# Patient Record
Sex: Female | Born: 1935 | Race: White | Hispanic: No | Smoking: Never smoker
Health system: Southern US, Community
[De-identification: ages and names within clinical notes are randomized; demographics above are authoritative.]

## PROBLEM LIST (undated history)

## (undated) DIAGNOSIS — J40 Bronchitis, not specified as acute or chronic: Secondary | ICD-10-CM

## (undated) DIAGNOSIS — Z8719 Personal history of other diseases of the digestive system: Secondary | ICD-10-CM

## (undated) DIAGNOSIS — C801 Malignant (primary) neoplasm, unspecified: Secondary | ICD-10-CM

## (undated) DIAGNOSIS — K219 Gastro-esophageal reflux disease without esophagitis: Secondary | ICD-10-CM

## (undated) DIAGNOSIS — Z9889 Other specified postprocedural states: Secondary | ICD-10-CM

## (undated) DIAGNOSIS — T4145XA Adverse effect of unspecified anesthetic, initial encounter: Secondary | ICD-10-CM

## (undated) DIAGNOSIS — T8859XA Other complications of anesthesia, initial encounter: Secondary | ICD-10-CM

## (undated) DIAGNOSIS — IMO0001 Reserved for inherently not codable concepts without codable children: Secondary | ICD-10-CM

## (undated) DIAGNOSIS — M1990 Unspecified osteoarthritis, unspecified site: Secondary | ICD-10-CM

## (undated) DIAGNOSIS — I10 Essential (primary) hypertension: Secondary | ICD-10-CM

## (undated) DIAGNOSIS — Z87442 Personal history of urinary calculi: Secondary | ICD-10-CM

## (undated) DIAGNOSIS — R112 Nausea with vomiting, unspecified: Secondary | ICD-10-CM

## (undated) DIAGNOSIS — Z8489 Family history of other specified conditions: Secondary | ICD-10-CM

## (undated) HISTORY — PX: EYE SURGERY: SHX253

## (undated) HISTORY — PX: HEMORRHOID SURGERY: SHX153

## (undated) HISTORY — PX: NASAL SINUS SURGERY: SHX719

## (undated) HISTORY — PX: ABDOMINAL HYSTERECTOMY: SHX81

## (undated) HISTORY — PX: JOINT REPLACEMENT: SHX530

## (undated) HISTORY — PX: CHOLECYSTECTOMY: SHX55

## (undated) HISTORY — PX: SHOULDER OPEN ROTATOR CUFF REPAIR: SHX2407

---

## 2002-08-26 HISTORY — PX: CARDIAC CATHETERIZATION: SHX172

## 2007-10-14 ENCOUNTER — Inpatient Hospital Stay (HOSPITAL_COMMUNITY): Admission: RE | Admit: 2007-10-14 | Discharge: 2007-10-19 | Payer: Self-pay | Admitting: Orthopedic Surgery

## 2009-01-24 ENCOUNTER — Ambulatory Visit (HOSPITAL_COMMUNITY): Admission: RE | Admit: 2009-01-24 | Discharge: 2009-01-25 | Payer: Self-pay | Admitting: Orthopedic Surgery

## 2010-12-03 LAB — BASIC METABOLIC PANEL
BUN: 13 mg/dL (ref 6–23)
Calcium: 9.3 mg/dL (ref 8.4–10.5)
Creatinine, Ser: 0.82 mg/dL (ref 0.4–1.2)
GFR calc non Af Amer: >60 mL/min (ref >60)
Glucose, Bld: 90 mg/dL (ref 70–99)

## 2011-01-08 NOTE — H&P (Signed)
Kelly Bird, Kelly Bird               ACCOUNT NO.:  [id number]   MEDICAL RECORD NO.:  [id number]          PATIENT TYPE:  INP   LOCATION:  NA                           FACILITY:  [facility]   PHYSICIAN:  Kelly Bird, M.D.  DATE OF BIRTH:  1926-06-24   DATE OF ADMISSION:  10/14/2007  DATE OF DISCHARGE:                              HISTORY & PHYSICAL   PRESENT ILLNESS:  This 77 year old white female has been seen by [place] for  continued progressive problems concerning pain into her knees, more so  on the right than the left.  She is a very active lady, tries to me  maintain a high level of activity but, unfortunately, the osteoarthritis  that has been documented both by examination and x-ray is markedly  interfering with her day-to-day activities.  X-rays taken last December  have shown that the patient does have bilateral medial compartment  osteoarthritis but the right knee has shown a bone-on-bone deformity and  medial shift of the femur onto the tibia.  Dr. Simonne Bird discussed with  her all the options and went over the operative procedure, showing x-  rays as well as models, and after much discussion including the risks  and benefits of surgery, it was decided to go ahead with total knee  replacement arthroplasty to the right knee.   PAST MEDICAL HISTORY:  This lady has been in relatively good health  throughout her lifetime.  Her primary care physician is Dr. Zachery Bird  at [facility] in [place], [place].  His CNA Ms.  Kelly Bird.   PAST MEDICAL HISTORY:  1. Degenerative joint disease.  2. Arthritis.  3. She has had a colonoscopy in the past.  4. Hypercholesterolemia.  5. Hypertension.  6. Hyperlipidemia.  7. Osteoporosis.  8. Reflux.  9. History of a cholecystectomy.  10.Hysterectomy.  11.Nephrolithiasis.   CURRENT MEDICATIONS:  1. Toprol XL 50 mg tablet ER daily.  2. Zocor 20 mg tablet one daily.  3. Clarinex 5 mg tablet daily.  4. Xanax 0.25 mg  p.r.n.  5. Vitamin B12.  6. Glucosamine/chondroitin.  7. Vitamin E.  8. Vitamins and minerals.  9. Calcium plus D.  10.Meclizine.  11.Promethazine.  12.Xanax XR.  The last nine medications she will stop prior to      surgery.   She is allergic to ASPIRIN and SALICYLATES and NOVOCAIN.  She also  writes that she is allergic to __________ fluid pill and BENADRYL.   FAMILY HISTORY:  The __________ died at 70 of heart attacks, the mother  37 cancer.   SOCIAL HISTORY:  The patient is widowed.  Never any intake of alcohol or  tobacco products.  Has two children, lives alone in a two-level home.  She has a living will.   REVIEW OF SYSTEMS:  CNS: No seizure, stroke or paralysis, numbness,  double vision.  RESPIRATORY:  No productive cough, no hemoptysis, no  shortness of breath.  CARDIOVASCULAR:  No chest pain, no angina or  orthopnea.  GASTROINTESTINAL:  No nausea or vomiting, melena or bloody  stools.  GENITOURINARY:  No  discharge, dysuria or hematuria.  MUSCULOSKELETAL:  Primarily in present illness with her knees.   PHYSICAL EXAM:  This is an alert and cooperative, friendly, fully-  oriented 77 year old white female who is accompanied by her daughter,  Kelly Bird.  VITAL SIGNS:  Blood pressure 158/86 right arm seated, pulse 80 and  regular, respirations 12.  HEENT:  Normocephalic.  PERRLA, EOM intact.  Oropharynx is clear.  CHEST:  Clear to auscultation.  No rhonchi, no rales, no wheezes.  HEART:  Regular rate and rhythm.  No murmurs are heard.  ABDOMEN:  Obese, soft, nontender.  Liver and spleen not felt.  GENITALIA, RECTAL, PELVIC, BREASTS:  Not done, not pertinent to present  illness.  EXTREMITIES:  Right knee is seen with some crepitus on range of motion  and some popping noted as well.  No gross effusion but there is some  mild swelling, and the knee is not warm and no erythema.   ADMITTING DIAGNOSES:  1. Osteoarthritis of both knees, more so on the right than the left.  2.  Degenerative joint disease.  3. Hypertension.  4. Hyperlipidemia.  5. Osteoporosis.  6. Reflux.   PLAN:  The patient will undergo a right total knee replacement  arthroplasty.  She will probably use a spinal for this procedure as was  we discussed during the history and physical.  She chooses to have her  own physical therapist in [place], [place].  We certainly will approve  this.  There is mention of having a home CPM.  We do not usually do this  but we will certainly see how she does postoperatively.  She also  mentioned that Dr. Dorna Bird of [facility] will be covering her  Coumadin protocol postoperatively.  The literature suggests, and we  usually  patients with total joint replacements on Coumadin protocol for 4 weeks  after the date of surgery.  We endeavor to keep the INR at 2.0 during  that time.  Today all questions were encouraged and answered from Ms.  Den[place] and from her daughter.      Kelly Bird.    ______________________________  Kelly Bird, M.D.    DLU/MEDQ  D:  10/08/2007  T:  10/09/2007  Job:  [id number]   cc:   Kelly Bird  Fax: [phone]   Kelly Richmond, CNA  [facility]  FAX [phone]

## 2011-01-08 NOTE — Op Note (Signed)
Kelly Bird, Kelly Bird               ACCOUNT NO.:  [id number]   MEDICAL RECORD NO.:  [id number]          PATIENT TYPE:  OIB   LOCATION:  0098                         FACILITY[facility]  [facility]   PHYSICIAN:  [name], M.D.  DATE OF BIRTH:  [birth year]   DATE OF PROCEDURE:  01/24/2009  DATE OF DISCHARGE:                               OPERATIVE REPORT   PREOPERATIVE DIAGNOSIS:  Stiff right total knee replacement.   POSTOPERATIVE DIAGNOSIS:  Stiff right total knee replacement.   OPERATION:  Right knee arthrotomy with lysis of extensive adhesions.   SURGEON:  [name], M.D.   ASSISTANT:  Nurse.   ANESTHESIA:  General.   PATHOLOGY AND INDICATIONS FOR PROCEDURE:  Original surgery was on  October 14, 2007, and at the time of her original discharge, she had  acceptable knee motion from 0 to roughly 90 degrees or more.  Subsequently, however, she lost her knee flexion and, despite aggressive  physical therapy, has never been able to regain it to her satisfaction  and consequently she is here today for the above-mentioned surgical  procedure.   PROCEDURE:  Prophylactic antibiotics, satisfactory spinal anesthesia,  pneumatic tourniquet applied and right leg was Esmarched out non  sterilely and inflated to 300 mmHg.  Right leg was then prepped with  DuraPrep from tourniquet to ankle and draped in sterile field, I-band  employed.  Time-out performed.  I went through the old surgical  incision, leaving some untouched at the proximal and distal extremes,  and then to the patella and with median parapatellar incision opened the  joint.  There was no evidence for any infection or component breakage or  damage.  I then began freeing up the patellar mechanism.  She had  extensive suprapatellar adhesions.  I used mainly scissors for lysis of  adhesions, but also cutting cautery.  I did not use the knife to avoid  any nicking of the components.  I eventually was able to free her up so  that her  calf could touch her thigh which was equivalent about a 105-110  degrees of flexion.  This was the maximal I could achieve.  I then  irrigated the wound well and closed the deeper tissues with the knee in  this amount of flexion with multiple interrupted #1 Vicryl.  For the  subcutaneous tissue, I then brought the knee into full extension.  I did  a slight lateral parapatellar release and also placed several Titus  stitches in the capsule medially with the knee in extension.  Subcutaneous tissue was closed with 2-0 Vicryl staples in the skin.  Betadine and Adaptic dry sterile dressing were applied.  Tourniquet was  released with slightly less than an hour of tourniquet time.  She  tolerated the procedure well and was taken to the recovery room in  satisfactory condition with no known complications.           ______________________________  [name], M.D.    JA/MEDQ  D:  01/24/2009  T:  01/24/2009  Job:  [id number]

## 2011-01-08 NOTE — Op Note (Signed)
Kelly Bird, Kelly Bird               ACCOUNT NO.:  [id number]   MEDICAL RECORD NO.:  [id number]          PATIENT TYPE:  INP   LOCATION:  0004                         FACILITY:  [facility]   PHYSICIAN:  [name], M.D.  DATE OF BIRTH:  08/15/1926   DATE OF PROCEDURE:  10/14/2007  DATE OF DISCHARGE:                               OPERATIVE REPORT   PREOPERATIVE DIAGNOSIS:  Osteoarthritis, right knee.   POSTOPERATIVE DIAGNOSIS:  Osteoarthritis, right knee.   OPERATION:  Osteonix total knee replacement, right.   SURGEON:  [name], M.D.   ASSISTANTDruscilla Brownie. Underwood, P.A.-C.   ANESTHESIA:  General.   PATHOLOGY AND JUSTIFICATION FOR PROCEDURE:  Painful right knee  unresponsive to all other treatments and consequently, she is here today  for the above mentioned surgery.  She had close to but not quite bone on  bone abutment medially in her knee joint.   DESCRIPTION OF PROCEDURE:  Prophylactic antibiotics, satisfactory  general anesthesia, Foley catheter inserted, pneumatic tourniquet,  SureFoot, and lateral hip stabilizer.  The right leg was prepped with  DuraPrep from tourniquet to ankle and draped in a sterile field, Ioban  employed.  The leg was Esmarch'd out sterilely.  A time out performed.  A vertical midline incision down to the patellar mechanism with medial  parapatellar incision to open the joint.  The pes anserinus and medial  collateral ligament were freed up from the proximal medial tibia.  The  patellar mechanism was freed up.  The patella was everted and the knee  flexed.  The remnants of the ACL and PCL were resected as well as  portions of both menisci.  A 5/16 inch drill hole was placed in the  distal femur followed by the canal finder and the axis aligner set for 5  degrees of valgus cut for the right knee.  10 mm bone was resected from  the distal femur.  She had no appreciable flexion contracture.  I then  sized her femur at a #7 using the cradle  apparatus and scribe lines were  placed on the distal femur followed by a distal femoral cutting jig  where anterior and posterior cuts and anterior and posterior chamfers  were made.  I then went to the tibia where I made a leveling cut,  remnants of both menisci were excised.  I then sized the tibia at a  probable 5 and using the baseplate, the initial intramedullary drill  hole was made followed by the step drill and the canal finder.  I then  used the intramedullary rod with the jig for making the proximal tibial  cuts at 90 degrees.  I took 4 mm off recess medial tibial plateau.  We  turned to the femur.  I then used the jig for creating the patellar  groove and also the notch plasty.  After this was completed, I placed  the laminar spreader and removed remnants of bone and soft tissue from  the posterior femoral condyles.  I then went through a trial reduction  finding a 10 mm spacer seemed to be idea.  The knee went into neutral  and extension and had good stability.  I used the rod to align the  tibial tray with the rod splitting the bimalleolar interval.  Scribe  lines were placed on the tibia for use later.  I then measured the  patella, it was a size 26, and using the 10 mm recessed cutting jig, we  made our 10 mm recess cut and then used the guide placing the three peg  holes.  I then placed the trial patella and trimmed bone from around the  perimeter.  I then returned to the tibia.  I placed the baseplate  placing three holes on the tibia using the previously noted markings and  then using the tripod apparatus, we reamed for the keel up to a 5  cemented.  We then water picked the knee well while the components were  opened and methacrylate was mixed.  I then cemented the individual  components with a glue gun, starting first with the tibia, impacting it,  and removing excess methacrylate.  I then glued in the femur and  impacted and removed excess methyl methacrylate.  I held  the knee in  extension with the 10 mm spacer while we glued in the patella holding it  with a patellar holding clamp.  Bone wax placed on areas of raw bone.  Once the methacrylate had hardened, I removed the patellar holder and we  inspected the components for any small portions of methacrylate  remaining.  The 10 mm spacer trial appeared to be the correct size and  we then placed the final 10 mm posterior stabilized insert.  The knee  had excellent flexion extension and the patella was stable, no lateral  release was required.  I placed a Hemovac and we closed the wound in  layers with interrupted #1 Vicryl in two layers in the quadriceps tendon  distally and synovium and capsule with the same, the subcutaneous  tissues closed with a combination of #1 and 2-0 Vicryl, and staples for  the skin.  A dry, sterile dressing was applied.  The tourniquet was  released.  A knee immobilizer was applied.  She tolerated the procedure  well and was taken to the recovery room in satisfactory condition with  no known complications and no blood loss.           ______________________________  [name], M.D.     JA/MEDQ  D:  10/14/2007  T:  10/14/2007  Job:  [id number]

## 2011-01-11 NOTE — Discharge Summary (Signed)
Kelly Bird, Bird               ACCOUNT NO.:  [id number]   MEDICAL RECORD NO.:  [id number]          PATIENT TYPE:  INP   LOCATION:  1604                         FACILITY:  [facility]   PHYSICIAN:  [name], M.D.  DATE OF BIRTH:  July 11, 1926   DATE OF ADMISSION:  10/14/2007  DATE OF DISCHARGE:  10/19/2007                               DISCHARGE SUMMARY   ADMITTING DIAGNOSES:  1. Osteoarthritis both knees more from the right than left.  2. Degenerative joint disease.  3. Hypertension.  4. Hyperlipidemia.  5. Osteoporosis.  6. Reflux.   DISCHARGE DIAGNOSES:  1. Osteoarthritis both knees more from the left.  2. Degenerative joint disease.  3. Hypertension.  4. Hyperlipidemia.  5. Osteoporosis.  6. Reflux.  7. Mild postoperative anemia.   OPERATION:  On October 14, 2007 the patient underwent Osteonics total  knee replacement arthroplasty of the right knee.  [name]  assisted.   CONSULTATIONS:  None.   BRIEF HISTORY:  This 77 year old lady had continuing problems concerning  both of her knees.  She has had bilateral medial compartment arthritis  and developed bone-on-bone deformity on the right knee.  Also had  shifted a femur onto the tibia.  After much discussion including risks  and benefits of surgery it was decided she would benefit with surgical  intervention, and was admitted for the above procedure.   COURSE IN THE HOSPITAL:  The patient tolerated surgical procedure quite  well.  She was placed in the therapy protocol postoperatively.  She  progressed very nicely in therapy.  Wound remained clean and dry.  There  were no postoperative complications.  We understood that Dr. Dorna Leitz, her  family physician in [place], is going to control her protimes, keep the  INR around 2, and once this was done it was felt that she could be  maintained in her home environment with home health, and arrangements  were made for discharge.   She was placed on Coumadin  protocol postoperatively for the prevention  of DVT.  She has had all of her equipment at home, and asked for  [name] to help her with her home health.  Once this was arranged we  discharged her home.   LABORATORY DATA:  Laboratory values in the hospital hematologically  showed a CBC within normal limits preoperatively.  She did drop to 9.4  postoperatively, and eventually her hemoglobin came up to 10.6.  Blood  chemistries were negative other than very slightly elevated nonfasting  glucose level.  Urinalysis negative for urinary tract infection.  No  chest x-ray or electrocardiogram seen on this chart.   CONDITION ON DISCHARGE:  Improved, stable.   PLAN:  The patient discharged to her home to continue with the home  health for protocol.  She is given Vicodin for pain, Robaxin for muscle  relaxant, and Coumadin per Dr. Dorna Leitz.  Also Trinsicon one b.i.d. for  anemia.  She is to resume her home diet and medications per Dr. Dorna Leitz.   MEDICATIONS AT DISCHARGE:  1. Toprol XL 50 mg tab ER daily.  2. Zocor  20 mg tab daily.  3. Clarinex 5 mg tablet daily.  4. Xanax 0.25 mg p.r.n.  5. Vitamin B12.  6. Glucosamine chondroitin.  7. Vitamin E.  8. Vitamins and minerals.  9. Calcium Plus D.  10.Meclizine.  11.Promethazine.   She will return to see [place] two weeks after date of surgery.  Use dry  dressing on as needed basis and may weightbear as tolerated.      [name].    ______________________________  [name], M.D.    DLU/MEDQ  D:  11/04/2007  T:  11/05/2007  Job:  [id number]   cc:   Suzy Bouchard  Fax: [phone]   Carmin Richmond, CNA  [facility]  fax #[phone]

## 2011-01-11 NOTE — Discharge Summary (Signed)
NAMEMARISKA, DAFFIN               ACCOUNT NO.:  [id number]   MEDICAL RECORD NO.:  [id number]          PATIENT TYPE:  INP   LOCATION:  1604                         FACILITY:  [facility]   PHYSICIAN:  Dooley L. Underwood, P.A.DATE OF BIRTH:  February 17, 1926   DATE OF ADMISSION:  10/14/2007  DATE OF DISCHARGE:  10/19/2007                               DISCHARGE SUMMARY   ADDENDUM   This discharge summary was dictated on 11/04/2007, dictation # is [id number]      Terie Purser L. Cherlynn June.     DLU/MEDQ  D:  11/05/2007  T:  11/05/2007  Job:  [id number]

## 2011-05-17 LAB — PROTIME-INR
INR: 1.2
INR: 2.3 — ABNORMAL HIGH
INR: 2.6 — ABNORMAL HIGH
Prothrombin Time: 13.4
Prothrombin Time: 26.1 — ABNORMAL HIGH

## 2011-05-17 LAB — URINALYSIS, ROUTINE W REFLEX MICROSCOPIC
Bilirubin Urine: NEGATIVE
Glucose, UA: NEGATIVE
Ketones, ur: NEGATIVE
Leukocytes, UA: NEGATIVE
Protein, ur: NEGATIVE

## 2011-05-17 LAB — BASIC METABOLIC PANEL
BUN: 12
CO2: 35 — ABNORMAL HIGH
CO2: 31
Calcium: 9.7
Calcium: 8.2 — ABNORMAL LOW
Calcium: 8.3 — ABNORMAL LOW
Chloride: 98
Creatinine, Ser: 0.75
GFR calc Af Amer: >60
GFR calc Af Amer: >60
GFR calc Af Amer: >60
GFR calc non Af Amer: >60
GFR calc non Af Amer: >60
Glucose, Bld: 130 — ABNORMAL HIGH
Glucose, Bld: 138 — ABNORMAL HIGH
Potassium: 3.2 — ABNORMAL LOW
Potassium: 3.6
Sodium: 133 — ABNORMAL LOW
Sodium: 133 — ABNORMAL LOW
Sodium: 137

## 2011-05-17 LAB — CBC
HCT: 39.6
HCT: 27.3 — ABNORMAL LOW
HCT: 30.5 — ABNORMAL LOW
Hemoglobin: 10.7 — ABNORMAL LOW
Hemoglobin: 9.4 — ABNORMAL LOW
Hemoglobin: 9.7 — ABNORMAL LOW
MCHC: 34.6
MCV: 91.1
MCV: 90.8
Platelets: 280
RBC: 3 — ABNORMAL LOW
RBC: 3.1 — ABNORMAL LOW
RBC: 3.39 — ABNORMAL LOW
RDW: 12.3
RDW: 12.6
WBC: 6.5
WBC: 10.3
WBC: 9.7

## 2011-05-17 LAB — DIFFERENTIAL
Eosinophils Absolute: 0.2
Eosinophils Relative: 2
Lymphs Abs: 1.3

## 2011-05-17 LAB — TYPE AND SCREEN

## 2011-05-17 LAB — ABO/RH: ABO/RH(D): O POS

## 2011-05-17 LAB — HEMOGLOBIN AND HEMATOCRIT, BLOOD
HCT: 31 — ABNORMAL LOW
Hemoglobin: 10.6 — ABNORMAL LOW

## 2011-05-17 LAB — HEPATIC FUNCTION PANEL
Albumin: 3.9
Alkaline Phosphatase: 54
Total Bilirubin: 0.9

## 2015-11-14 ENCOUNTER — Other Ambulatory Visit: Payer: Self-pay | Admitting: Orthopedic Surgery

## 2015-11-14 DIAGNOSIS — M25511 Pain in right shoulder: Secondary | ICD-10-CM

## 2015-11-20 ENCOUNTER — Ambulatory Visit
Admission: RE | Admit: 2015-11-20 | Discharge: 2015-11-20 | Disposition: A | Discharge status: Home / Self Care | Payer: Medicare Other | Source: Ambulatory Visit | Attending: Orthopedic Surgery | Admitting: Orthopedic Surgery

## 2015-11-20 DIAGNOSIS — M25511 Pain in right shoulder: Secondary | ICD-10-CM

## 2016-01-11 ENCOUNTER — Other Ambulatory Visit: Payer: Self-pay | Admitting: Orthopedic Surgery

## 2016-01-24 ENCOUNTER — Encounter (HOSPITAL_COMMUNITY)
Admission: RE | Admit: 2016-01-24 | Discharge: 2016-01-24 | Disposition: A | Discharge status: Home / Self Care | Payer: Medicare (Managed Care) | Source: Ambulatory Visit | Attending: Orthopedic Surgery | Admitting: Orthopedic Surgery

## 2016-01-24 ENCOUNTER — Encounter (HOSPITAL_COMMUNITY): Payer: Self-pay

## 2016-01-24 DIAGNOSIS — Z79899 Other long term (current) drug therapy: Secondary | ICD-10-CM | POA: Insufficient documentation

## 2016-01-24 DIAGNOSIS — Z7982 Long term (current) use of aspirin: Secondary | ICD-10-CM | POA: Insufficient documentation

## 2016-01-24 DIAGNOSIS — I10 Essential (primary) hypertension: Secondary | ICD-10-CM | POA: Insufficient documentation

## 2016-01-24 DIAGNOSIS — Z01812 Encounter for preprocedural laboratory examination: Secondary | ICD-10-CM | POA: Insufficient documentation

## 2016-01-24 DIAGNOSIS — M7541 Impingement syndrome of right shoulder: Secondary | ICD-10-CM | POA: Insufficient documentation

## 2016-01-24 DIAGNOSIS — Z01818 Encounter for other preprocedural examination: Secondary | ICD-10-CM | POA: Insufficient documentation

## 2016-01-24 HISTORY — DX: Personal history of other diseases of the digestive system: Z87.19

## 2016-01-24 HISTORY — DX: Other specified postprocedural states: Z98.890

## 2016-01-24 HISTORY — DX: Reserved for inherently not codable concepts without codable children: IMO0001

## 2016-01-24 HISTORY — DX: Other complications of anesthesia, initial encounter: T88.59XA

## 2016-01-24 HISTORY — DX: Unspecified osteoarthritis, unspecified site: M19.90

## 2016-01-24 HISTORY — DX: Essential (primary) hypertension: I10

## 2016-01-24 HISTORY — DX: Adverse effect of unspecified anesthetic, initial encounter: T41.45XA

## 2016-01-24 HISTORY — DX: Other specified postprocedural states: R11.2

## 2016-01-24 HISTORY — DX: Malignant (primary) neoplasm, unspecified: C80.1

## 2016-01-24 LAB — BASIC METABOLIC PANEL
ANION GAP: 8 (ref 5–15)
BUN: 11 mg/dL (ref 6–20)
CHLORIDE: 103 mmol/L (ref 101–111)
CO2: 28 mmol/L (ref 22–32)
Calcium: 9.7 mg/dL (ref 8.9–10.3)
Creatinine, Ser: 0.78 mg/dL (ref 0.44–1.00)
GFR calc non Af Amer: >60 mL/min (ref >60)
GLUCOSE: 118 mg/dL — AB (ref 65–99)
POTASSIUM: 4.1 mmol/L (ref 3.5–5.1)
Sodium: 139 mmol/L (ref 135–145)

## 2016-01-24 LAB — CBC
HEMATOCRIT: 42.3 % (ref 36.0–46.0)
HEMOGLOBIN: 13.3 g/dL (ref 12.0–15.0)
MCH: 30 pg (ref 26.0–34.0)
MCHC: 31.4 g/dL (ref 30.0–36.0)
MCV: 95.5 fL (ref 78.0–100.0)
Platelets: 263 10*3/uL (ref 150–400)
RBC: 4.43 MIL/uL (ref 3.87–5.11)
RDW: 12.2 % (ref 11.5–15.5)
WBC: 7.2 10*3/uL (ref 4.0–10.5)

## 2016-01-24 NOTE — Pre-Procedure Instructions (Signed)
CNIYAH FITT  01/24/2016      [id number] [place] - [id number] [place] [address] Phone: [phone] Fax: [phone]    Your procedure is scheduled on 01/29/2016  Report to [facility] Admitting at: 10:30 A.M.  Call this number if you have problems the morning of surgery:  [phone]   Remember:  Do not eat food or drink liquids after midnight.   On Sunday NIGHT   Take these medicines the morning of surgery with A SIP OF WATER : Tylenol is OK the morning of surgery if desired/needed    Do not wear jewelry, make-up or nail polish.   Do not wear lotions, powders, or perfumes.  You may wear deodorant.   Do not shave 48 hours prior to surgery.     Do not bring valuables to the hospital.   [facility] [employer] [facility] is not responsible for any belongings or valuables.  Contacts, dentures or bridgework may not be worn into surgery.  Leave your suitcase in the car.  After surgery it may be brought to your room.  For patients admitted to the hospital, discharge time will be determined by your treatment team.  Patients discharged the day of surgery will not be allowed to drive home.   Name and phone number of your driver:   /w family  Special instructions:  Special Instructions: [place] - Preparing for Surgery  Before surgery, you can play an important role.  Because skin is not sterile, your skin needs to be as free of germs as possible.  You can reduce the number of germs on you skin by washing with CHG (chlorahexidine gluconate) soap before surgery.  CHG is an antiseptic cleaner which kills germs and bonds with the skin to continue killing germs even after washing.  Please DO NOT use if you have an allergy to CHG or antibacterial soaps.  If your skin becomes reddened/irritated stop using the CHG and inform your nurse when you arrive at Short Stay.  Do not shave (including legs and underarms) for at  least 48 hours prior to the first CHG shower.  You may shave your face.  Please follow these instructions carefully:   1.  Shower with CHG Soap the night before surgery and the  morning of Surgery.  2.  If you choose to wash your hair, wash your hair first as usual with your  normal shampoo.  3.  After you shampoo, rinse your hair and body thoroughly to remove the  Shampoo.  4.  Use CHG as you would any other liquid soap.  You can apply chg directly to the skin and wash gently with scrungie or a clean washcloth.  5.  Apply the CHG Soap to your body ONLY FROM THE NECK DOWN.    Do not use on open wounds or open sores.  Avoid contact with your eyes, ears, mouth and genitals (private parts).  Wash genitals (private parts)   with your normal soap.  6.  Wash thoroughly, paying special attention to the area where your surgery will be performed.  7.  Thoroughly rinse your body with warm water from the neck down.  8.  DO NOT shower/wash with your normal soap after using and rinsing off   the CHG Soap.  9.  Pat yourself dry with a clean towel.            10 .  Wear  clean pajamas.            11.  Place clean sheets on your bed the night of your first shower and do not sleep with pets.  Day of Surgery  Do not apply any lotions/deodorants the morning of surgery.  Please wear clean clothes to the hospital/surgery center.  Please read over the following fact sheets that you were given. Pain Booklet and Surgical Site Infection Prevention

## 2016-01-24 NOTE — Progress Notes (Signed)
Pt. Attended by [facility]. Group in [place] but they are a group out of [place].  After pt. Signed release of info., faxed requested info. For pt. To include: stress test done 2 yrs. Ago, notes fr: Pulmonary MD, Cardiology MD.   Of note pt. Uses O2  By nasal prongs at night & when she is resting during the day.  Pt. At PAT appt. Without any O2 today, pt. Denies all chest complaints.

## 2016-01-24 NOTE — Progress Notes (Signed)
Pt. Reports that she had Cardiac Cath. 2004, since then she has always used O2 at night.  Pt. Somewhat unsure of her cardiac history, states she has been seeing the cardiologist q yr. ever since the cath. 2004. Pt. Reports a stress test was done two yrs. Ago, reports that it was normal. Via fax have asked for the records from [place].

## 2016-01-25 NOTE — Progress Notes (Signed)
Anesthesia Chart Review:  Pt is an 82 year old female scheduled for R shoulder arthroscopy with subacromial decompression and distal clavicle excision on 01/29/2016 with Dr. Ronnie Derby.   Cardiologist is Dr. Richelle Ito, last office visit 05/26/15.   PMH includes:  HTN, post-op N/V. Never smoker. BMI 33  Medications include: ASA, losartan, metoprolol, prilosec, crestor  Preoperative labs reviewed.    EKG 11/23/15: sinus rhythm. Leftward axis. Possible septal infarct, age undetermined. Inferior ST-T changes are nonspecific.   Nuclear stress test 03/07/14:  1. Normal coronary flow reserve with lexiscan 2. Normal resting LV systolic function. EF 60%  Echo 03/04/14:  1. Normal LV size with moderate concentric LVH 2. Normal EF 65% 3. Normal RV size and systolic function 4. Estimated RVSP 30 mmHg, low est RAP <3 mmHg 5. Calcified but not stenotic AV  If no changes, I anticipate pt can proceed with surgery as scheduled.   Willeen Cass, FNP-BC [facility] Stay [facility]/Anesthesiology Phone: [phone] 01/26/2016 12:15 PM

## 2016-01-26 MED ORDER — SODIUM CHLORIDE 0.9 % IV SOLN: INTRAVENOUS | Status: DC

## 2016-01-26 MED ORDER — CEFAZOLIN SODIUM-DEXTROSE 2-4 GM/100ML-% IV SOLN
2.0000 g | INTRAVENOUS | Status: AC
  Administered 2016-01-29: 2 g via INTRAVENOUS
  Filled 2016-01-26: qty 100

## 2016-01-26 MED ORDER — ACETAMINOPHEN 500 MG PO TABS: 1000.0000 mg | ORAL_TABLET | Freq: Once | ORAL | Status: DC

## 2016-01-29 ENCOUNTER — Ambulatory Visit (HOSPITAL_COMMUNITY): Payer: Medicare Other | Admitting: Certified Registered Nurse Anesthetist

## 2016-01-29 ENCOUNTER — Ambulatory Visit (HOSPITAL_COMMUNITY): Payer: Medicare Other | Admitting: Emergency Medicine

## 2016-01-29 ENCOUNTER — Encounter (HOSPITAL_COMMUNITY)
Admission: RE | Disposition: A | Discharge status: Home / Self Care | Payer: Self-pay | Source: Ambulatory Visit | Attending: Orthopedic Surgery

## 2016-01-29 ENCOUNTER — Ambulatory Visit (HOSPITAL_COMMUNITY)
Admission: RE | Admit: 2016-01-29 | Discharge: 2016-01-29 | Disposition: A | Discharge status: Home / Self Care | Payer: Medicare Other | Source: Ambulatory Visit | Attending: Orthopedic Surgery | Admitting: Orthopedic Surgery

## 2016-01-29 ENCOUNTER — Encounter (HOSPITAL_COMMUNITY): Payer: Self-pay | Admitting: Certified Registered Nurse Anesthetist

## 2016-01-29 DIAGNOSIS — Z880 Allergy status to penicillin: Secondary | ICD-10-CM | POA: Insufficient documentation

## 2016-01-29 DIAGNOSIS — M19011 Primary osteoarthritis, right shoulder: Secondary | ICD-10-CM | POA: Diagnosis not present

## 2016-01-29 DIAGNOSIS — Z85828 Personal history of other malignant neoplasm of skin: Secondary | ICD-10-CM | POA: Diagnosis not present

## 2016-01-29 DIAGNOSIS — M1990 Unspecified osteoarthritis, unspecified site: Secondary | ICD-10-CM | POA: Diagnosis not present

## 2016-01-29 DIAGNOSIS — M7541 Impingement syndrome of right shoulder: Secondary | ICD-10-CM | POA: Insufficient documentation

## 2016-01-29 DIAGNOSIS — I10 Essential (primary) hypertension: Secondary | ICD-10-CM | POA: Insufficient documentation

## 2016-01-29 SURGERY — SHOULDER ARTHROSCOPY WITH SUBACROMIAL DECOMPRESSION AND DISTAL CLAVICLE EXCISION
Anesthesia: Regional | Site: Shoulder | Laterality: Right

## 2016-01-29 MED ORDER — PHENYLEPHRINE HCL 10 MG/ML IJ SOLN
10.0000 mg | INTRAVENOUS | Status: DC | PRN
  Administered 2016-01-29: 25 ug/min via INTRAVENOUS

## 2016-01-29 MED ORDER — LACTATED RINGERS IV SOLN
INTRAVENOUS | Status: DC
  Administered 2016-01-29 (×2): via INTRAVENOUS

## 2016-01-29 MED ORDER — PROPOFOL 10 MG/ML IV BOLUS
INTRAVENOUS | Status: DC | PRN
  Administered 2016-01-29: 50 mg via INTRAVENOUS
  Administered 2016-01-29: 100 mg via INTRAVENOUS

## 2016-01-29 MED ORDER — SUGAMMADEX SODIUM 200 MG/2ML IV SOLN
INTRAVENOUS | Status: DC | PRN
  Administered 2016-01-29: 160 mg via INTRAVENOUS

## 2016-01-29 MED ORDER — DEXAMETHASONE SODIUM PHOSPHATE 10 MG/ML IJ SOLN
INTRAMUSCULAR | Status: DC | PRN
  Administered 2016-01-29: 10 mg via INTRAVENOUS

## 2016-01-29 MED ORDER — CHLORHEXIDINE GLUCONATE 4 % EX LIQD: 60.0000 mL | Freq: Once | CUTANEOUS | Status: DC

## 2016-01-29 MED ORDER — IBUPROFEN 600 MG PO TABS: 600.0000 mg | ORAL_TABLET | Freq: Four times a day (QID) | ORAL | Status: DC | PRN

## 2016-01-29 MED ORDER — MIDAZOLAM HCL 2 MG/2ML IJ SOLN
INTRAMUSCULAR | Status: AC
  Filled 2016-01-29: qty 2

## 2016-01-29 MED ORDER — ROCURONIUM BROMIDE 100 MG/10ML IV SOLN
INTRAVENOUS | Status: DC | PRN
  Administered 2016-01-29: 30 mg via INTRAVENOUS

## 2016-01-29 MED ORDER — FENTANYL CITRATE (PF) 100 MCG/2ML IJ SOLN
INTRAMUSCULAR | Status: AC
  Administered 2016-01-29: 50 ug
  Filled 2016-01-29: qty 2

## 2016-01-29 MED ORDER — FENTANYL CITRATE (PF) 250 MCG/5ML IJ SOLN
INTRAMUSCULAR | Status: DC | PRN
  Administered 2016-01-29: 50 ug via INTRAVENOUS

## 2016-01-29 MED ORDER — FENTANYL CITRATE (PF) 250 MCG/5ML IJ SOLN
INTRAMUSCULAR | Status: AC
  Filled 2016-01-29: qty 5

## 2016-01-29 MED ORDER — ROPIVACAINE HCL 5 MG/ML IJ SOLN
INTRAMUSCULAR | Status: DC | PRN
  Administered 2016-01-29: 25 mL via PERINEURAL

## 2016-01-29 MED ORDER — SODIUM CHLORIDE 0.9 % IR SOLN
Status: DC | PRN
  Administered 2016-01-29: 3000 mL

## 2016-01-29 MED ORDER — SUGAMMADEX SODIUM 200 MG/2ML IV SOLN
INTRAVENOUS | Status: AC
  Filled 2016-01-29: qty 2

## 2016-01-29 MED ORDER — ONDANSETRON HCL 4 MG/2ML IJ SOLN
INTRAMUSCULAR | Status: DC | PRN
  Administered 2016-01-29: 4 mg via INTRAVENOUS

## 2016-01-29 MED ORDER — HYDROCODONE-ACETAMINOPHEN 5-325 MG PO TABS: 1.0000 | ORAL_TABLET | Freq: Four times a day (QID) | ORAL | Status: DC | PRN

## 2016-01-29 SURGICAL SUPPLY — 52 items
BLADE GREAT WHITE 4.2 (BLADE) ×2 IMPLANT
BLADE GREAT WHITE 4.2MM (BLADE) ×1
BLADE SURG 11 STRL SS (BLADE) ×3 IMPLANT
BUR OVAL 4.0 (BURR) ×3 IMPLANT
CANNULA SHOULDER 7CM (CANNULA) ×3 IMPLANT
CLOSURE WOUND 1/2 X4 (GAUZE/BANDAGES/DRESSINGS)
DRAPE INCISE IOBAN 66X45 STRL (DRAPES) IMPLANT
DRAPE STERI 35X30 U-POUCH (DRAPES) ×3 IMPLANT
DRAPE U-SHAPE 47X51 STRL (DRAPES) ×3 IMPLANT
DRSG EMULSION OIL 3X3 NADH (GAUZE/BANDAGES/DRESSINGS) ×3 IMPLANT
DRSG PAD ABDOMINAL 8X10 ST (GAUZE/BANDAGES/DRESSINGS) ×3 IMPLANT
DURAPREP 26ML APPLICATOR (WOUND CARE) ×3 IMPLANT
ELECT REM PT RETURN 9FT ADLT (ELECTROSURGICAL) ×3
ELECTRODE REM PT RTRN 9FT ADLT (ELECTROSURGICAL) ×1 IMPLANT
GAUZE SPONGE 4X4 12PLY STRL (GAUZE/BANDAGES/DRESSINGS) ×3 IMPLANT
GAUZE XEROFORM 1X8 LF (GAUZE/BANDAGES/DRESSINGS) ×3 IMPLANT
GLOVE BIOGEL M 7.0 STRL (GLOVE) ×3 IMPLANT
GLOVE BIOGEL PI IND STRL 7.5 (GLOVE) ×1 IMPLANT
GLOVE BIOGEL PI IND STRL 8.5 (GLOVE) ×5 IMPLANT
GLOVE BIOGEL PI INDICATOR 7.5 (GLOVE) ×2
GLOVE BIOGEL PI INDICATOR 8.5 (GLOVE) ×10
GLOVE SURG ORTHO 8.0 STRL STRW (GLOVE) ×18 IMPLANT
GOWN STRL REUS W/ TWL LRG LVL3 (GOWN DISPOSABLE) ×2 IMPLANT
GOWN STRL REUS W/ TWL XL LVL3 (GOWN DISPOSABLE) ×2 IMPLANT
GOWN STRL REUS W/TWL 2XL LVL3 (GOWN DISPOSABLE) ×3 IMPLANT
GOWN STRL REUS W/TWL LRG LVL3 (GOWN DISPOSABLE) ×4
GOWN STRL REUS W/TWL XL LVL3 (GOWN DISPOSABLE) ×4
KIT BASIN OR (CUSTOM PROCEDURE TRAY) ×3 IMPLANT
KIT ROOM TURNOVER OR (KITS) ×3 IMPLANT
MANIFOLD NEPTUNE II (INSTRUMENTS) ×3 IMPLANT
NEEDLE 1/2 CIR CATGUT .05X1.09 (NEEDLE) IMPLANT
NEEDLE HYPO 25GX1X1/2 BEV (NEEDLE) IMPLANT
NEEDLE SCORPION MULTI FIRE (NEEDLE) IMPLANT
NEEDLE SPNL 18GX3.5 QUINCKE PK (NEEDLE) ×3 IMPLANT
NS IRRIG 1000ML POUR BTL (IV SOLUTION) IMPLANT
PACK SHOULDER (CUSTOM PROCEDURE TRAY) ×3 IMPLANT
PAD ARMBOARD 7.5X6 YLW CONV (MISCELLANEOUS) ×6 IMPLANT
RESTRAINT HEAD UNIVERSAL NS (MISCELLANEOUS) ×3 IMPLANT
SET ARTHROSCOPY TUBING (MISCELLANEOUS) ×2
SET ARTHROSCOPY TUBING LN (MISCELLANEOUS) ×1 IMPLANT
SLING ARM FOAM STRAP MED (SOFTGOODS) ×3 IMPLANT
SPONGE LAP 4X18 X RAY DECT (DISPOSABLE) ×6 IMPLANT
STRIP CLOSURE SKIN 1/2X4 (GAUZE/BANDAGES/DRESSINGS) IMPLANT
SUT ETHILON 3 0 PS 1 (SUTURE) ×3 IMPLANT
SUT PROLENE 4 0 PS 2 18 (SUTURE) IMPLANT
SUT VIC AB 2-0 CT1 27 (SUTURE)
SUT VIC AB 2-0 CT1 TAPERPNT 27 (SUTURE) IMPLANT
SYR CONTROL 10ML LL (SYRINGE) IMPLANT
TAPE PAPER 3X10 WHT MICROPORE (GAUZE/BANDAGES/DRESSINGS) ×3 IMPLANT
TOWEL OR 17X24 6PK STRL BLUE (TOWEL DISPOSABLE) ×6 IMPLANT
WAND HAND CNTRL MULTIVAC 90 (MISCELLANEOUS) ×3 IMPLANT
WATER STERILE IRR 1000ML POUR (IV SOLUTION) ×3 IMPLANT

## 2016-01-29 NOTE — Anesthesia Procedure Notes (Addendum)
Anesthesia Regional Block:  Interscalene brachial plexus block  Pre-Anesthetic Checklist: ,, timeout performed, Correct Patient, Correct Site, Correct Laterality, Correct Procedure, Correct Position, site marked, Risks and benefits discussed, pre-op evaluation,  At surgeon's request and post-op pain management  Laterality: Right  Prep: Maximum Sterile Barrier Precautions used and chloraprep       Needles:  Injection technique: Single-shot  Needle Type: Echogenic Stimulator Needle     Needle Length: 5cm 5 cm Needle Gauge: 22 and 22 G    Additional Needles:  Procedures: ultrasound guided (picture in chart) and nerve stimulator Interscalene brachial plexus block  Nerve Stimulator or Paresthesia:  Response: Biceps response,   Additional Responses:   Narrative:  Start time: 01/29/2016 11:45 AM End time: 01/29/2016 11:55 AM Injection made incrementally with aspirations every 5 mL. Anesthesiologist: Roderic Palau  Additional Notes: 2% Lidocaine skin wheel.    Procedure Name: Intubation Date/Time: 01/29/2016 12:18 PM Performed by: Layla Maw Pre-anesthesia Checklist: Patient identified, Patient being monitored, Timeout performed, Emergency Drugs available and Suction available Patient Re-evaluated:Patient Re-evaluated prior to inductionOxygen Delivery Method: Circle System Utilized Preoxygenation: Pre-oxygenation with 100% oxygen Intubation Type: IV induction Ventilation: Mask ventilation without difficulty Laryngoscope Size: Miller and 2 Grade View: Grade II Tube type: Oral Tube size: 7.5 mm Number of attempts: 1 Airway Equipment and Method: Stylet Placement Confirmation: ETT inserted through vocal cords under direct vision,  positive ETCO2 and breath sounds checked- equal and bilateral Secured at: 21 cm Tube secured with: Tape Dental Injury: Teeth and Oropharynx as per pre-operative assessment

## 2016-01-29 NOTE — Anesthesia Preprocedure Evaluation (Addendum)
Anesthesia Evaluation  Patient identified by MRN, date of birth, ID band Patient awake    Reviewed: Allergy & Precautions, H&P , NPO status , Patient's Chart, lab work & pertinent test results, reviewed documented beta blocker date and time   History of Anesthesia Complications (+) PONV and history of anesthetic complications  Airway Mallampati: II  TM Distance: >3 FB Neck ROM: Full    Dental no notable dental hx. (+) Teeth Intact, Dental Advisory Given   Pulmonary shortness of breath and with exertion,    Pulmonary exam normal breath sounds clear to auscultation       Cardiovascular Exercise Tolerance: Poor hypertension, Pt. on medications and Pt. on home beta blockers  Rhythm:Regular Rate:Normal     Neuro/Psych negative neurological ROS  negative psych ROS   GI/Hepatic Neg liver ROS, hiatal hernia,   Endo/Other  negative endocrine ROS  Renal/GU negative Renal ROS  negative genitourinary   Musculoskeletal  (+) Arthritis , Osteoarthritis,    Abdominal   Peds  Hematology negative hematology ROS (+)   Anesthesia Other Findings   Reproductive/Obstetrics negative OB ROS                          BP Readings from Last 3 Encounters:  01/24/16 161/78   Lab Results  Component Value Date   WBC 7.2 01/24/2016   HGB 13.3 01/24/2016   HCT 42.3 01/24/2016   MCV 95.5 01/24/2016   PLT 263 01/24/2016     Chemistry      Component Value Date/Time   NA 139 01/24/2016 1633   K 4.1 01/24/2016 1633   CL 103 01/24/2016 1633   CO2 28 01/24/2016 1633   BUN 11 01/24/2016 1633   CREATININE 0.78 01/24/2016 1633      Component Value Date/Time   CALCIUM 9.7 01/24/2016 1633   ALKPHOS 54 10/08/2007 1150   AST 20 10/08/2007 1150   ALT 17 10/08/2007 1150   BILITOT 0.9 10/08/2007 1150     EKG 11/23/15: sinus rhythm. Leftward axis. Possible septal infarct, age undetermined. Inferior ST-T changes are  nonspecific.   Nuclear stress test 03/07/14:  1. Normal coronary flow reserve with lexiscan 2. Normal resting LV systolic function. EF 60%  Echo 03/04/14:  1. Normal LV size with moderate concentric LVH 2. Normal EF 65% 3. Normal RV size and systolic function 4. Estimated RVSP 30 mmHg, low est RAP <3 mmHg 5. Calcified but not stenotic AV  Anesthesia Physical Anesthesia Plan  ASA: II  Anesthesia Plan: General and Regional   Post-op Pain Management: GA combined w/ Regional for post-op pain   Induction: Intravenous  Airway Management Planned: Oral ETT  Additional Equipment:   Intra-op Plan:   Post-operative Plan: Extubation in OR  Informed Consent: I have reviewed the patients History and Physical, chart, labs and discussed the procedure including the risks, benefits and alternatives for the proposed anesthesia with the patient or authorized representative who has indicated his/her understanding and acceptance.   Dental advisory given  Plan Discussed with: CRNA  Anesthesia Plan Comments:         Anesthesia Quick Evaluation

## 2016-01-29 NOTE — H&P (Signed)
Kelly Bird MRN:  [id number] DOB/SEX:  07/22/1926/female  CHIEF COMPLAINT:  Painful right Shoulder  HISTORY: Patient is a 82 y.o. female presented with a history of pain in the right shoulder. Onset of symptoms was gradual starting a few years ago with gradually worsening course since that time. Patient has been treated conservatively with over-the-counter NSAIDs and activity modification. Patient currently rates pain in the shoulder at 8 out of 10 with activity. There is pain at night.  PAST MEDICAL HISTORY: There are no active problems to display for this patient.  Past Medical History  Diagnosis Date  . Complication of anesthesia   . PONV (postoperative nausea and vomiting)   . Hypertension   . Shortness of breath dyspnea   . Renal calculus   . History of hiatal hernia   . Arthritis     shoulders, knees, hands   . Cancer ([place])     occasional basal cell ca, recent biopsy taken from L forearm   Past Surgical History  Procedure Laterality Date  . Shoulder open rotator cuff repair Left   . Cholecystectomy    . Hemorrhoid surgery    . Joint replacement Right     revision- 2009  . Eye surgery      IOL- L eye   . Cardiac catheterization  2004  . Nasal sinus surgery    . Abdominal hysterectomy       MEDICATIONS:   No prescriptions prior to admission    ALLERGIES:   Allergies  Allergen Reactions  . Cilostazol Diarrhea and Other (See Comments)    Headache  . Lidocaine Hcl Other (See Comments)    Unknown  . Novocain [Procaine] Other (See Comments)    Dental - pt has passed out from use of med, dentist no longer gives this to her but she states it was relative to "hitting a nerve"   . Penicillins Other (See Comments)    Childhood Allergy Unspecified  . Diphenhydramine Hcl Other (See Comments)    Increase heart rate    REVIEW OF SYSTEMS:  A comprehensive review of systems was negative. Except for MSK which was positive for arthralgias and joint pain.   FAMILY  HISTORY:  No family history on file.  SOCIAL HISTORY:   Social History  Substance Use Topics  . Smoking status: Never Smoker   . Smokeless tobacco: Not on file  . Alcohol Use: No     EXAMINATION:  Vital signs in last 24 hours:    There were no vitals taken for this visit.  General Appearance:    Alert, cooperative, no distress, appears stated age  Head:    Normocephalic, without obvious abnormality, atraumatic  Eyes:    PERRL, conjunctiva/corneas clear, EOM's intact, fundi    benign, both eyes  Ears:    Normal TM's and external ear canals, both ears  Nose:   Nares normal, septum midline, mucosa normal, no drainage    or sinus tenderness  Throat:   Lips, mucosa, and tongue normal; teeth and gums normal  Neck:   Supple, symmetrical, trachea midline, no adenopathy;    thyroid:  no enlargement/tenderness/nodules; no carotid   bruit or JVD  Back:     Symmetric, no curvature, ROM normal, no CVA tenderness  Lungs:     Clear to auscultation bilaterally, respirations unlabored  Chest Wall:    No tenderness or deformity   Heart:    Regular rate and rhythm, S1 and S2 normal, no murmur, rub  or gallop  Breast Exam:    No tenderness, masses, or nipple abnormality  Abdomen:     Soft, non-tender, bowel sounds active all four quadrants,    no masses, no organomegaly  Genitalia:    Normal female without lesion, discharge or tenderness  Rectal:    Normal tone, no masses or tenderness;   guaiac negative stool  Extremities:   Extremities normal, atraumatic, no cyanosis or edema  Pulses:   2+ and symmetric all extremities  Skin:   Skin color, texture, turgor normal, no rashes or lesions  Lymph nodes:   Cervical, supraclavicular, and axillary nodes normal  Neurologic:   CNII-XII intact, normal strength, sensation and reflexes    throughout    Musculoskeletal:  ROM is decreased, Ligaments intact,  Imaging Review Plain radiographs demonstrate mild degenerative joint disease of the right  shoulder. The overall alignment is neutral. The bone quality appears to be excellent for age and reported activity level.  Assessment/Plan: Impingement Syndrome right shoulder  The patient history, physical examination and imaging studies are consistent with Impingement Syndrome of the right shoulder. The patient has failed conservative treatment.  The clearance notes were reviewed.  After discussion with the patient it was felt that a shoulder decompression was indicated. The procedure,  risks, and benefits of shoulder decompression were presented and reviewed. The risks including but not limited to, infection, blood clots, vascular injury, stiffness, among others were discussed. The patient acknowledged the explanation, agreed to proceed with the plan.  Donia Ast 01/29/2016, 6:41 AM

## 2016-01-29 NOTE — Transfer of Care (Signed)
Immediate Anesthesia Transfer of Care Note  Patient: Kelly Bird  Procedure(s) Performed: Procedure(s): SHOULDER ARTHROSCOPY WITH SUBACROMIAL DECOMPRESSION AND DISTAL CLAVICLE EXCISION (Right)  Patient Location: PACU  Anesthesia Type:GA combined with regional for post-op pain  Level of Consciousness: awake, alert , oriented and patient cooperative  Airway & Oxygen Therapy: Patient Spontanous Breathing and Patient connected to nasal cannula oxygen  Post-op Assessment: Report given to RN and Post -op Vital signs reviewed and stable  Post vital signs: Reviewed and stable  Last Vitals:  Filed Vitals:   01/29/16 1145 01/29/16 1150  BP: 189/85 195/95  Pulse: 81 83  Temp:    Resp: 23 23    Last Pain:  Filed Vitals:   01/29/16 1155  PainSc: 2       Patients Stated Pain Goal: 2 ([id number])  Complications: No apparent anesthesia complications

## 2016-01-29 NOTE — Anesthesia Postprocedure Evaluation (Signed)
Anesthesia Post Note  Patient: Kelly Bird  Procedure(s) Performed: Procedure(s) (LRB): SHOULDER ARTHROSCOPY WITH SUBACROMIAL DECOMPRESSION AND DISTAL CLAVICLE EXCISION (Right)  Patient location during evaluation: PACU Anesthesia Type: General and Regional Level of consciousness: awake and alert Pain management: pain level controlled Vital Signs Assessment: post-procedure vital signs reviewed and stable Respiratory status: spontaneous breathing, nonlabored ventilation and respiratory function stable Cardiovascular status: blood pressure returned to baseline and stable Postop Assessment: no signs of nausea or vomiting Anesthetic complications: no    Last Vitals:  Filed Vitals:   01/29/16 1415 01/29/16 1430  BP: 170/93 154/89  Pulse: 77 79  Temp:    Resp: 14 14    Last Pain:  Filed Vitals:   01/29/16 1432  PainSc: 2                  Marisa Hufstetler,W. EDMOND

## 2016-01-29 NOTE — Op Note (Addendum)
OPERATIVE NOTE:  01/29/2016  4:03 PM  PATIENT:  Kelly Bird  82 y.o. female  PRE-OPERATIVE DIAGNOSIS:  Right Shoulder Impingement Syndrome  POST-OPERATIVE DIAGNOSIS:  Right Shoulder Impingement Syndrome  PROCEDURE:  Procedure(s): SHOULDER ARTHROSCOPY WITH SUBACROMIAL DECOMPRESSION AND DISTAL CLAVICLE EXCISION  SURGEON:  Surgeon(s): Vickey Huger, MD  PHYSICIAN ASSISTANT: Nehemiah Massed, [facility]  ANESTHESIA:   general  DRAINS: Hemovac  SPECIMEN: None  COUNTS:  Correct  DICTATION:  Indication for procedure:    The patient is a 82 y.o. female who has failed conservative treatment for Right Shoulder Impingement Syndrome.  Informed consent was obtained prior to anesthesia. The risks versus benefits of the operation were explain and in a way the patient can, and did, understand.   Description of procedure:     The patient was taken to the operating room and placed under anesthesia.  The patient was positioned in the usual fashion taking care that all body parts were adequately padded and/or protected.  IThe patient was prepped and draped in the beach chair position.  Anterior, posterior and direct lateral portals were created with a #11 blade, blunt trocar and canula.  Diagnostic arthroscopy was carried out.  There was a torn biceps tendon with the proximal 1.5cm dangling in the joint.  This was debrided through the anterior portal.  Anterior and posterior synovits was debrided as well.  I then redirected the scope into the subacromial space.  I performed a bursectomy from the lateral portal.  I release the CA lignament with the arthrocare wand.  I performed an aggressive acrimioplasty with the 4.72mm burr.  I also performed a distal clavicle excision removing 76mm of bone off the distal clavicle.    This afforded excellent decompression.  I finished my debridement of extensive partial thickness tearing.  I then lavaged and closed with 4-0 nylons.  Dressed with a Neer shoulder dressing.       PLAN OF CARE: Discharge to home after anesthesia clearance.  PATIENT DISPOSITION:  PACU - hemodynamically stable.   Delay start of Pharmacological VTE agent (>24hrs) due to surgical blood loss or risk of bleeding:  not applicable  Please fax a copy of this op note to my office at [phone] (please only include page 1 and 2 of the Case Information op note)

## 2016-03-22 ENCOUNTER — Other Ambulatory Visit: Payer: Self-pay | Admitting: Orthopedic Surgery

## 2016-03-22 DIAGNOSIS — M25562 Pain in left knee: Secondary | ICD-10-CM

## 2016-04-02 ENCOUNTER — Ambulatory Visit
Admission: RE | Admit: 2016-04-02 | Discharge: 2016-04-02 | Disposition: A | Discharge status: Home / Self Care | Payer: Medicare Other | Source: Ambulatory Visit | Attending: Orthopedic Surgery | Admitting: Orthopedic Surgery

## 2016-04-02 DIAGNOSIS — M25562 Pain in left knee: Secondary | ICD-10-CM

## 2016-04-17 ENCOUNTER — Other Ambulatory Visit: Payer: Self-pay | Admitting: Orthopedic Surgery

## 2016-04-19 ENCOUNTER — Encounter (HOSPITAL_COMMUNITY)
Admission: RE | Admit: 2016-04-19 | Discharge: 2016-04-19 | Disposition: A | Discharge status: Home / Self Care | Payer: Medicare Other | Source: Ambulatory Visit | Attending: Orthopedic Surgery | Admitting: Orthopedic Surgery

## 2016-04-19 ENCOUNTER — Encounter (HOSPITAL_COMMUNITY): Payer: Self-pay

## 2016-04-19 DIAGNOSIS — Z01812 Encounter for preprocedural laboratory examination: Secondary | ICD-10-CM | POA: Diagnosis not present

## 2016-04-19 HISTORY — DX: Bronchitis, not specified as acute or chronic: J40

## 2016-04-19 HISTORY — DX: Family history of other specified conditions: Z84.89

## 2016-04-19 HISTORY — DX: Personal history of urinary calculi: Z87.442

## 2016-04-19 HISTORY — DX: Gastro-esophageal reflux disease without esophagitis: K21.9

## 2016-04-19 LAB — CBC
HCT: 39.6 % (ref 36.0–46.0)
Hemoglobin: 12.4 g/dL (ref 12.0–15.0)
MCH: 30.3 pg (ref 26.0–34.0)
MCHC: 31.3 g/dL (ref 30.0–36.0)
MCV: 96.8 fL (ref 78.0–100.0)
PLATELETS: 238 10*3/uL (ref 150–400)
RBC: 4.09 MIL/uL (ref 3.87–5.11)
RDW: 12.3 % (ref 11.5–15.5)
WBC: 8 10*3/uL (ref 4.0–10.5)

## 2016-04-19 LAB — BASIC METABOLIC PANEL
ANION GAP: 7 (ref 5–15)
BUN: 12 mg/dL (ref 6–20)
CALCIUM: 9.6 mg/dL (ref 8.9–10.3)
CO2: 29 mmol/L (ref 22–32)
CREATININE: 0.83 mg/dL (ref 0.44–1.00)
Chloride: 105 mmol/L (ref 101–111)
Glucose, Bld: 107 mg/dL — ABNORMAL HIGH (ref 65–99)
Potassium: 3.8 mmol/L (ref 3.5–5.1)
SODIUM: 141 mmol/L (ref 135–145)

## 2016-04-19 MED ORDER — ACETAMINOPHEN 500 MG PO TABS
1000.0000 mg | ORAL_TABLET | Freq: Once | ORAL | Status: AC
  Administered 2016-04-22: 1000 mg via ORAL
  Filled 2016-04-19: qty 2

## 2016-04-19 MED ORDER — SODIUM CHLORIDE 0.9 % IV SOLN: INTRAVENOUS | Status: DC

## 2016-04-19 NOTE — Progress Notes (Signed)
Anesthesia Chart Review: Pt is an 82 year old female scheduled for left knee arthroscopy on 04/22/16 by Dr. Ronnie Derby. PAT was at 3:15PM on Friday.  PMH includes:  HTN, hiatal hernia, skin cancer, GERD, cholecystectomy, hysterectomy, nasal sinus surgery, post-op N/V, right shoulder arthroscopy 01/29/16. Never smoker. She is on home O2 2L/[place] at night. BMI 28.58.   PCP is Dr. Richelle Ito.  Pulmonologist is Dr. Davina Poke with [facility] [phone]). Records requested, but pending.  Records from [facility] (received prior to her June 2017 surgery) are scanned under the Media tab, Correspondence, Encounter 01/29/16. These include stress, echo, EKG which are outlined below.  Medications include: ASA (on hold), losartan, metoprolol, fish oil, Prilosec, Crestor.  BP (!) 151/60   Pulse 92   Temp 36.7 C   Resp 20   Ht 5' 4.5" (1.638 m)   Wt 169 lb 1.6 oz (76.7 kg)   SpO2 95%   BMI 28.58 kg/m   Preoperative labs reviewed.    EKG 11/23/15: sinus rhythm. Leftward axis. Possible septal infarct, age undetermined. Inferior ST-T changes are nonspecific.   Nuclear stress test 03/07/14:  1. Normal coronary flow reserve with lexiscan 2. Normal resting LV systolic function. EF 60%  Echo 03/04/14:  1. Normal LV size with moderate concentric LVH 2. Normal EF 65% 3. Normal RV size and systolic function 4. Estimated RVSP 30 mmHg, low est RAP <3 mmHg 5. Calcified but not stenotic AV  She tolerated shoulder arthroscopy < 3 months ago. If no acute changes then I would anticipate that she could proceed as planned.  George Hugh [facility] Short Stay Center/Anesthesiology Phone [phone] 04/19/2016 4:46 PM

## 2016-04-19 NOTE — Pre-Procedure Instructions (Addendum)
Kelly Bird  04/19/2016      [id number] [place] - [id number] [place] [address] Phone: [phone] Fax: [phone]    Your procedure is scheduled on 04/22/16  Report to [facility] Admitting at 1240 P.M.  Call this number if you have problems the morning of surgery:  [phone]   Remember:  Do not eat food or drink liquids after midnight.  Take these medicines the morning of surgery with A SIP OF WATER hydrocodone if needed, metoprolol(lopressor),omeprazole(prilosec)  STOP all herbel meds, nsaids (aleve,naproxen,advil,ibuprofen) 5 days prior to surgery now including vitamins, fish oil, aspirin,cranberry   Do not wear jewelry, make-up or nail polish.  Do not wear lotions, powders, or perfumes, or deoderant.  Do not shave 48 hours prior to surgery.  Men may shave face and neck.  Do not bring valuables to the hospital.  [facility] is not responsible for any belongings or valuables.  Contacts, dentures or bridgework may not be worn into surgery.  Leave your suitcase in the car.  After surgery it may be brought to your room.  For patients admitted to the hospital, discharge time will be determined by your treatment team.  Patients discharged the day of surgery will not be allowed to drive home.   Name and phone number of your driver:    Special instructions:   Special Instructions: [place] - Preparing for Surgery  Before surgery, you can play an important role.  Because skin is not sterile, your skin needs to be as free of germs as possible.  You can reduce the number of germs on you skin by washing with CHG (chlorahexidine gluconate) soap before surgery.  CHG is an antiseptic cleaner which kills germs and bonds with the skin to continue killing germs even after washing.  Please DO NOT use if you have an allergy to CHG or antibacterial soaps.  If your skin becomes reddened/irritated  stop using the CHG and inform your nurse when you arrive at Short Stay.  Do not shave (including legs and underarms) for at least 48 hours prior to the first CHG shower.  You may shave your face.  Please follow these instructions carefully:   1.  Shower with CHG Soap the night before surgery and the morning of Surgery.  2.  If you choose to wash your hair, wash your hair first as usual with your normal shampoo.  3.  After you shampoo, rinse your hair and body thoroughly to remove the Shampoo.  4.  Use CHG as you would any other liquid soap.  You can apply chg directly  to the skin and wash gently with scrungie or a clean washcloth.  5.  Apply the CHG Soap to your body ONLY FROM THE NECK DOWN.  Do not use on open wounds or open sores.  Avoid contact with your eyes ears, mouth and genitals (private parts).  Wash genitals (private parts)       with your normal soap.  6.  Wash thoroughly, paying special attention to the area where your surgery will be performed.  7.  Thoroughly rinse your body with warm water from the neck down.  8.  DO NOT shower/wash with your normal soap after using and rinsing off the CHG Soap.  9.  Pat yourself dry with a clean towel.            10.  Wear  clean pajamas.            11.  Place clean sheets on your bed the night of your first shower and do not sleep with pets.  Day of Surgery  Do not apply any lotions/deodorants the morning of surgery.  Please wear clean clothes to the hospital/surgery center.  Please read over the  fact sheets that you were given.

## 2016-04-22 ENCOUNTER — Encounter (HOSPITAL_COMMUNITY): Payer: Self-pay | Admitting: *Deleted

## 2016-04-22 ENCOUNTER — Encounter (HOSPITAL_COMMUNITY)
Admission: RE | Disposition: A | Discharge status: Home / Self Care | Payer: Self-pay | Source: Ambulatory Visit | Attending: Orthopedic Surgery

## 2016-04-22 ENCOUNTER — Ambulatory Visit (HOSPITAL_COMMUNITY)
Admission: RE | Admit: 2016-04-22 | Discharge: 2016-04-22 | Disposition: A | Discharge status: Home / Self Care | Payer: Medicare Other | Source: Ambulatory Visit | Attending: Orthopedic Surgery | Admitting: Orthopedic Surgery

## 2016-04-22 ENCOUNTER — Ambulatory Visit (HOSPITAL_COMMUNITY): Payer: Medicare Other | Admitting: Anesthesiology

## 2016-04-22 ENCOUNTER — Ambulatory Visit (HOSPITAL_COMMUNITY): Payer: Medicare Other | Admitting: Vascular Surgery

## 2016-04-22 DIAGNOSIS — K219 Gastro-esophageal reflux disease without esophagitis: Secondary | ICD-10-CM | POA: Insufficient documentation

## 2016-04-22 DIAGNOSIS — M1990 Unspecified osteoarthritis, unspecified site: Secondary | ICD-10-CM | POA: Insufficient documentation

## 2016-04-22 DIAGNOSIS — Z7982 Long term (current) use of aspirin: Secondary | ICD-10-CM | POA: Diagnosis not present

## 2016-04-22 DIAGNOSIS — I10 Essential (primary) hypertension: Secondary | ICD-10-CM | POA: Insufficient documentation

## 2016-04-22 DIAGNOSIS — M23304 Other meniscus derangements, unspecified medial meniscus, left knee: Secondary | ICD-10-CM | POA: Diagnosis present

## 2016-04-22 DIAGNOSIS — Z880 Allergy status to penicillin: Secondary | ICD-10-CM | POA: Diagnosis not present

## 2016-04-22 DIAGNOSIS — Z85828 Personal history of other malignant neoplasm of skin: Secondary | ICD-10-CM | POA: Insufficient documentation

## 2016-04-22 DIAGNOSIS — M1712 Unilateral primary osteoarthritis, left knee: Secondary | ICD-10-CM | POA: Diagnosis not present

## 2016-04-22 HISTORY — PX: KNEE ARTHROSCOPY WITH MEDIAL MENISECTOMY: SHX5651

## 2016-04-22 SURGERY — ARTHROSCOPY, KNEE, WITH MEDIAL MENISCECTOMY
Anesthesia: General | Laterality: Left

## 2016-04-22 MED ORDER — ONDANSETRON HCL 4 MG/2ML IJ SOLN
INTRAMUSCULAR | Status: DC | PRN
  Administered 2016-04-22: 4 mg via INTRAVENOUS

## 2016-04-22 MED ORDER — IBUPROFEN 600 MG PO TABS
600.0000 mg | ORAL_TABLET | Freq: Three times a day (TID) | ORAL | 0 refills | Status: AC | PRN
Start: 2016-04-22 — End: ?

## 2016-04-22 MED ORDER — LACTATED RINGERS IV SOLN
INTRAVENOUS | Status: DC
  Administered 2016-04-22: 14:00:00 via INTRAVENOUS

## 2016-04-22 MED ORDER — LABETALOL HCL 5 MG/ML IV SOLN
INTRAVENOUS | Status: AC
  Filled 2016-04-22: qty 4

## 2016-04-22 MED ORDER — SODIUM CHLORIDE 0.9 % IR SOLN
Status: DC | PRN
  Administered 2016-04-22 (×2): 3000 mL

## 2016-04-22 MED ORDER — LIDOCAINE 2% (20 MG/ML) 5 ML SYRINGE
INTRAMUSCULAR | Status: AC
Start: 2016-04-22 — End: 2016-04-22
  Filled 2016-04-22: qty 10

## 2016-04-22 MED ORDER — EPHEDRINE 5 MG/ML INJ
INTRAVENOUS | Status: AC
  Filled 2016-04-22: qty 10

## 2016-04-22 MED ORDER — FENTANYL CITRATE (PF) 100 MCG/2ML IJ SOLN: 25.0000 ug | INTRAMUSCULAR | Status: DC | PRN

## 2016-04-22 MED ORDER — BUPIVACAINE-EPINEPHRINE 0.5% -1:200000 IJ SOLN
INTRAMUSCULAR | Status: DC | PRN
  Administered 2016-04-22: 30 mL

## 2016-04-22 MED ORDER — CHLORHEXIDINE GLUCONATE 4 % EX LIQD: 60.0000 mL | Freq: Once | CUTANEOUS | Status: DC

## 2016-04-22 MED ORDER — BUPIVACAINE-EPINEPHRINE (PF) 0.5% -1:200000 IJ SOLN
INTRAMUSCULAR | Status: AC
  Filled 2016-04-22: qty 30

## 2016-04-22 MED ORDER — LABETALOL HCL 5 MG/ML IV SOLN
10.0000 mg | Freq: Once | INTRAVENOUS | Status: AC
  Administered 2016-04-22: 10 mg via INTRAVENOUS

## 2016-04-22 MED ORDER — ONDANSETRON HCL 4 MG/2ML IJ SOLN: 4.0000 mg | Freq: Once | INTRAMUSCULAR | Status: DC | PRN

## 2016-04-22 MED ORDER — PHENYLEPHRINE 40 MCG/ML (10ML) SYRINGE FOR IV PUSH (FOR BLOOD PRESSURE SUPPORT)
PREFILLED_SYRINGE | INTRAVENOUS | Status: AC
  Filled 2016-04-22: qty 10

## 2016-04-22 MED ORDER — PHENYLEPHRINE HCL 10 MG/ML IJ SOLN
INTRAMUSCULAR | Status: DC | PRN
  Administered 2016-04-22: 80 ug via INTRAVENOUS
  Administered 2016-04-22: 40 ug via INTRAVENOUS

## 2016-04-22 MED ORDER — FENTANYL CITRATE (PF) 100 MCG/2ML IJ SOLN
INTRAMUSCULAR | Status: AC
  Filled 2016-04-22: qty 2

## 2016-04-22 MED ORDER — FENTANYL CITRATE (PF) 100 MCG/2ML IJ SOLN
INTRAMUSCULAR | Status: DC | PRN
Start: 2016-04-22 — End: 2016-04-22
  Administered 2016-04-22 (×4): 25 ug via INTRAVENOUS
  Administered 2016-04-22: 50 ug via INTRAVENOUS

## 2016-04-22 MED ORDER — PROPOFOL 10 MG/ML IV BOLUS
INTRAVENOUS | Status: DC | PRN
  Administered 2016-04-22: 160 mg via INTRAVENOUS

## 2016-04-22 MED ORDER — 0.9 % SODIUM CHLORIDE (POUR BTL) OPTIME
TOPICAL | Status: DC | PRN
  Administered 2016-04-22: 1000 mL

## 2016-04-22 MED ORDER — OXYCODONE HCL 5 MG PO TABS: 5.0000 mg | ORAL_TABLET | ORAL | 0 refills | Status: AC | PRN

## 2016-04-22 MED ORDER — ONDANSETRON HCL 4 MG/2ML IJ SOLN
INTRAMUSCULAR | Status: AC
  Filled 2016-04-22: qty 6

## 2016-04-22 SURGICAL SUPPLY — 35 items
BANDAGE ACE 6X5 VEL STRL LF (GAUZE/BANDAGES/DRESSINGS) ×3 IMPLANT
BLADE GREAT WHITE 4.2 (BLADE) ×2 IMPLANT
BLADE GREAT WHITE 4.2MM (BLADE) ×1
DRAPE ARTHROSCOPY W/POUCH 114 (DRAPES) ×3 IMPLANT
DRAPE U-SHAPE 47X51 STRL (DRAPES) ×3 IMPLANT
DRSG PAD ABDOMINAL 8X10 ST (GAUZE/BANDAGES/DRESSINGS) ×3 IMPLANT
ELECT REM PT RETURN 9FT ADLT (ELECTROSURGICAL)
ELECTRODE REM PT RTRN 9FT ADLT (ELECTROSURGICAL) IMPLANT
GAUZE SPONGE 4X4 12PLY STRL (GAUZE/BANDAGES/DRESSINGS) ×3 IMPLANT
GAUZE XEROFORM 1X8 LF (GAUZE/BANDAGES/DRESSINGS) ×3 IMPLANT
GLOVE BIOGEL PI IND STRL 8.5 (GLOVE) ×5 IMPLANT
GLOVE BIOGEL PI INDICATOR 8.5 (GLOVE) ×10
GLOVE SURG ORTHO 8.0 STRL STRW (GLOVE) ×18 IMPLANT
GOWN STRL REUS W/ TWL LRG LVL3 (GOWN DISPOSABLE) ×1 IMPLANT
GOWN STRL REUS W/TWL 2XL LVL3 (GOWN DISPOSABLE) ×3 IMPLANT
GOWN STRL REUS W/TWL LRG LVL3 (GOWN DISPOSABLE) ×2
GOWN STRL REUS W/TWL XL LVL3 (GOWN DISPOSABLE) ×6 IMPLANT
KIT ROOM TURNOVER OR (KITS) ×3 IMPLANT
MANIFOLD NEPTUNE II (INSTRUMENTS) ×3 IMPLANT
PACK ARTHROSCOPY DSU (CUSTOM PROCEDURE TRAY) ×3 IMPLANT
PAD ARMBOARD 7.5X6 YLW CONV (MISCELLANEOUS) ×6 IMPLANT
PADDING CAST ABS 4INX4YD NS (CAST SUPPLIES) ×2
PADDING CAST ABS COTTON 4X4 ST (CAST SUPPLIES) ×1 IMPLANT
PENCIL BUTTON HOLSTER BLD 10FT (ELECTRODE) IMPLANT
SET ARTHROSCOPY TUBING (MISCELLANEOUS) ×2
SET ARTHROSCOPY TUBING LN (MISCELLANEOUS) ×1 IMPLANT
SPONGE GAUZE 4X4 12PLY STER LF (GAUZE/BANDAGES/DRESSINGS) ×3 IMPLANT
SPONGE LAP 18X18 X RAY DECT (DISPOSABLE) ×3 IMPLANT
SPONGE LAP 4X18 X RAY DECT (DISPOSABLE) ×3 IMPLANT
SUT ETHILON 4 0 PS 2 18 (SUTURE) ×3 IMPLANT
SYR 30ML LL (SYRINGE) ×6 IMPLANT
TOWEL OR 17X24 6PK STRL BLUE (TOWEL DISPOSABLE) ×3 IMPLANT
TOWEL OR 17X26 10 PK STRL BLUE (TOWEL DISPOSABLE) ×3 IMPLANT
WAND HAND CNTRL MULTIVAC 90 (MISCELLANEOUS) ×3 IMPLANT
WATER STERILE IRR 1000ML POUR (IV SOLUTION) IMPLANT

## 2016-04-22 NOTE — Progress Notes (Signed)
Called Dr Jillyn Hidden regarding elevated BP of 201/93. Will give 10mg  of Labetalol IV per his order. No parameters given at this time. Per Dr Jillyn Hidden educate patient to take Cozaar upon arrival home.

## 2016-04-22 NOTE — Anesthesia Procedure Notes (Signed)
Procedure Name: LMA Insertion Date/Time: 04/22/2016 3:42 PM Performed by: Merrilyn Puma B Pre-anesthesia Checklist: Patient identified, Emergency Drugs available, Suction available, Patient being monitored and Timeout performed Patient Re-evaluated:Patient Re-evaluated prior to inductionOxygen Delivery Method: Circle system utilized Preoxygenation: Pre-oxygenation with 100% oxygen Intubation Type: IV induction Ventilation: Mask ventilation without difficulty LMA: LMA inserted LMA Size: 4.0 Number of attempts: 1 Placement Confirmation: positive ETCO2,  breath sounds checked- equal and bilateral and CO2 detector Tube secured with: Tape Dental Injury: Teeth and Oropharynx as per pre-operative assessment

## 2016-04-22 NOTE — Anesthesia Postprocedure Evaluation (Signed)
Anesthesia Post Note  Patient: Kelly Bird  Procedure(s) Performed: Procedure(s) (LRB): KNEE ARTHROSCOPY WITH MEDIAL MENISECTOMY (Left)  Patient location during evaluation: PACU Anesthesia Type: General Level of consciousness: awake and alert Pain management: pain level controlled Vital Signs Assessment: post-procedure vital signs reviewed and stable Respiratory status: spontaneous breathing, nonlabored ventilation, respiratory function stable and patient connected to nasal cannula oxygen Cardiovascular status: blood pressure returned to baseline and stable Postop Assessment: no signs of nausea or vomiting Anesthetic complications: no    Last Vitals:  Vitals:   04/22/16 1254 04/22/16 1629  BP: (!) 198/97 (!) 164/89  Pulse: 77 73  Resp: 20 15  Temp:  36.8 C    Last Pain:  Vitals:   04/22/16 1629  PainSc: 0-No pain                 Zenaida Deed

## 2016-04-22 NOTE — Discharge Summary (Signed)
SPORTS MEDICINE & JOINT REPLACEMENT   Lara Mulch, MD   Carlyon Shadow, PA-C [address], [place], [place]  [id number]                             [phone]  PATIENT ID: Kelly Bird        MRN:  [id number]          DOB/AGE: [birth year] / 82 y.o.    DISCHARGE SUMMARY  ADMISSION DATE:    04/22/2016 DISCHARGE DATE:   04/22/2016   ADMISSION DIAGNOSIS: LT Knee Torn Medial Meniscus    DISCHARGE DIAGNOSIS:  LT Knee Torn Medial Meniscus    ADDITIONAL DIAGNOSIS: Active Problems:   * No active hospital problems. *  Past Medical History:  Diagnosis Date  . Arthritis    shoulders, knees, hands   . Bronchitis    hx-   . Cancer ([place])    occasional basal cell ca, recent biopsy taken from L forearm  . Complication of anesthesia   . Family history of adverse reaction to anesthesia    dtr n&v  . GERD (gastroesophageal reflux disease)   . History of hiatal hernia   . History of kidney stones   . Hypertension   . PONV (postoperative nausea and vomiting)   . Shortness of breath dyspnea     PROCEDURE: Procedure(s): KNEE ARTHROSCOPY WITH MEDIAL MENISECTOMY on 04/22/2016  CONSULTS:    HISTORY:  See H&P in chart  HOSPITAL COURSE:  Kelly Bird is a 82 y.o. admitted on 04/22/2016 and found to have a diagnosis of LT Knee Torn Medial Meniscus.  After appropriate laboratory studies were obtained  they were taken to the operating room on 04/22/2016 and underwent Procedure(s): KNEE ARTHROSCOPY WITH MEDIAL MENISECTOMY.   They were given perioperative antibiotics:  Anti-infectives    None    .  Patient given tranexamic acid IV or topical and exparel intra-operatively.  Tolerated the procedure well.    POD# 1: Vital signs were stable.  Patient denied Chest pain, shortness of breath, or calf pain.  Patient was started on Lovenox 30 mg subcutaneously twice daily at 8am.  Consults to PT, OT, and care management were made.  The patient was weight bearing as tolerated.  CPM was  placed on the operative leg 0-90 degrees for 6-8 hours a day. When out of the CPM, patient was placed in the foam block to achieve full extension. Incentive spirometry was taught.  Dressing was changed.       POD #2, Continued  PT for ambulation and exercise program.  IV saline locked.  O2 discontinued.    The remainder of the hospital course was dedicated to ambulation and strengthening.   The patient was discharged on Day of Surgery in  Good condition.  Blood products given:none  DIAGNOSTIC STUDIES: Recent vital signs: Patient Vitals for the past 24 hrs:  BP Temp Pulse Resp SpO2 Height Weight  04/22/16 1657 (!) 172/87 98.6 F (37 C) - - - - -  04/22/16 1650 (!) 201/93 - 67 17 98 % - -  04/22/16 1640 (!) 170/90 - 69 16 100 % - -  04/22/16 1629 (!) 164/89 98.2 F (36.8 C) 73 15 99 % - -  04/22/16 1254 (!) 198/97 - 77 20 97 % 5' 4.5" (1.638 m) 72.6 kg (160 lb)       Recent laboratory studies:  Recent Labs  04/19/16 1549  WBC  8.0  HGB 12.4  HCT 39.6  PLT 238    Recent Labs  04/19/16 1549  NA 141  K 3.8  CL 105  CO2 29  BUN 12  CREATININE 0.83  GLUCOSE 107*  CALCIUM 9.6   Lab Results  Component Value Date   INR 2.4 (H) 10/19/2007   INR 2.3 (H) 10/18/2007   INR 2.6 (H) 10/17/2007     Recent Radiographic Studies :  Mr Knee Left  Wo Contrast  Result Date: 04/03/2016 CLINICAL DATA:  Intermittent left knee pain for 1 month. EXAM: MRI OF THE LEFT KNEE WITHOUT CONTRAST TECHNIQUE: Multiplanar, multisequence MR imaging of the knee was performed. No intravenous contrast was administered. COMPARISON:  None. FINDINGS: Despite efforts by the technologist and patient, motion artifact is present on today's exam and could not be eliminated. This reduces exam sensitivity and specificity. MENISCI Medial meniscus: Large radial tear of the root of the posterior horn with adjacent grade 2 signal tracking in the posterior horn and a grade 1 degenerative signal in the midbody. Lateral  meniscus:  Unremarkable LIGAMENTS Cruciates:  Unremarkable Collaterals: Mild abnormal thickening of the MCL proximally with low-grade surrounding edema. Appearance compatible with grade 2 sprain. CARTILAGE Patellofemoral: Prominent diffuse degenerative chondral thinning. Marginal spurring. Medial: Prominent chondral thinning along the medial femoral condyle with moderate chondral thinning along the medial tibial plateau. Marginal spurring with adjacent subcortical marrow edema along the medial tibial rim and adjacent medial rim of the medial femoral condyle. Lateral: 4 x 13 mm chondral defect centrally along the lateral femoral condyle, image 9/5, with adjacent focal chondral thinning in the lateral tibial plateau. Otherwise mild degenerative chondral thinning. Marginal spurring. Joint:  Small knee effusion. Popliteal Fossa:  Small Baker's cyst. Extensor Mechanism: Subtle linear increased signal in the distal quadriceps tendon is likely incidental but in the appropriate clinical setting could represent a quadriceps sprain. Bones: No significant extra-articular osseous abnormalities identified. Other: No supplemental non-categorized findings. IMPRESSION: 1. Large radial tear of the root of the posterior horn medial meniscus. 2. Grade 2 sprain of the proximal MCL. 3. Prominent osteoarthritis, with prominent chondral thinning in the patellofemoral joint and medial compartment, and a focal chondral defect centrally along the lateral femoral condyle 4. Small knee effusion with small Baker's cyst. 5. Subtle linear increased signal in the distal quadriceps tendon is likely incidental but in the appropriate clinical setting could represent a quadriceps sprain. Electronically Signed   By: Van Clines M.D.   On: 04/03/2016 08:29    DISCHARGE INSTRUCTIONS: Discharge Instructions    Call MD / Call 911    Complete by:  As directed   If you experience chest pain or shortness of breath, CALL 911 and be transported to  the hospital emergency room.  If you develope a fever above 101 F, pus (white drainage) or increased drainage or redness at the wound, or calf pain, call your surgeon's office.   Constipation Prevention    Complete by:  As directed   Drink plenty of fluids.  Prune juice may be helpful.  You may use a stool softener, such as Colace (over the counter) 100 mg twice a day.  Use MiraLax (over the counter) for constipation as needed.   Diet - low sodium heart healthy    Complete by:  As directed   Discharge instructions    Complete by:  As directed   Keep dressing clean/dry x 2 days  Patient may then get wound wet  Change dressing daily  Follow up in office in 1 week   Increase activity slowly as tolerated    Complete by:  As directed      DISCHARGE MEDICATIONS:     Medication List    STOP taking these medications   HYDROcodone-acetaminophen 5-325 MG tablet Commonly known as:  NORCO/VICODIN     TAKE these medications   acetaminophen 325 MG tablet Commonly known as:  TYLENOL Take 325 mg by mouth every 6 (six) hours as needed.   aspirin 81 MG tablet Take 81 mg by mouth daily.   CALCIUM + D3 PO Take 1 tablet by mouth daily.   CRANBERRY PO Take 1 tablet by mouth daily.   Fish Oil 1200 MG Caps Take 1 capsule by mouth daily.   ibuprofen 600 MG tablet Commonly known as:  ADVIL,MOTRIN Take 1 tablet (600 mg total) by mouth every 8 (eight) hours as needed. What changed:  when to take this   losartan 100 MG tablet Commonly known as:  COZAAR Take 100 mg by mouth daily.   meclizine 25 MG tablet Commonly known as:  ANTIVERT Take 25 mg by mouth 3 (three) times daily as needed for dizziness.   metoprolol tartrate 25 MG tablet Commonly known as:  LOPRESSOR Take 25 mg by mouth every evening.   multivitamin with minerals tablet Take 1 tablet by mouth daily.   omeprazole 20 MG capsule Commonly known as:  PRILOSEC Take 20 mg by mouth daily as needed.   oxyCODONE 5 MG immediate  release tablet Commonly known as:  Oxy IR/ROXICODONE Take 1 tablet (5 mg total) by mouth every 4 (four) hours as needed for severe pain.   OXYGEN Inhale 2 L into the lungs See admin instructions. Pt uses at bedtime and when resting. Approximately 15-18 Hours daily   REFRESH OP Place 1 drop into both eyes 2 (two) times daily.   rosuvastatin 5 MG tablet Commonly known as:  CRESTOR Take 5 mg by mouth every evening.   vitamin B-12 1000 MCG tablet Commonly known as:  CYANOCOBALAMIN Take 1,000 mcg by mouth daily.       FOLLOW UP VISIT:    DISPOSITION: HOME VS. SNF  CONDITION:  Good   Donia Ast 04/22/2016, 9:04 PM

## 2016-04-22 NOTE — H&P (Signed)
Kelly Bird MRN:  [id number] DOB/SEX:  01/31/1926/female  CHIEF COMPLAINT:  Painful left Knee  HISTORY: Patient is a 82 y.o. female presented with a history of pain in the left knee. Onset of symptoms was abrupt starting a few months ago with gradually worsening course since that time. Patient has been treated conservatively with over-the-counter NSAIDs and activity modification. Patient currently rates pain in the knee at 8 out of 10 with activity. There is pain at night.  PAST MEDICAL HISTORY: There are no active problems to display for this patient.  Past Medical History:  Diagnosis Date  . Arthritis    shoulders, knees, hands   . Bronchitis    hx-   . Cancer ([place])    occasional basal cell ca, recent biopsy taken from L forearm  . Complication of anesthesia   . Family history of adverse reaction to anesthesia    dtr n&v  . GERD (gastroesophageal reflux disease)   . History of hiatal hernia   . History of kidney stones   . Hypertension   . PONV (postoperative nausea and vomiting)   . Shortness of breath dyspnea    Past Surgical History:  Procedure Laterality Date  . ABDOMINAL HYSTERECTOMY    . CARDIAC CATHETERIZATION  2004  . CHOLECYSTECTOMY    . EYE SURGERY Left    IOL- L eye  cataract-rt also done  . HEMORRHOID SURGERY    . JOINT REPLACEMENT Right    revision- 2009  . NASAL SINUS SURGERY    . SHOULDER OPEN ROTATOR CUFF REPAIR Left      MEDICATIONS:   No prescriptions prior to admission.    ALLERGIES:   Allergies  Allergen Reactions  . Cilostazol Diarrhea and Other (See Comments)    Headache  . Lidocaine Hcl Other (See Comments)    UNSPECIFIED   . Penicillins Other (See Comments)    UNSPECIFIED CHILDHOOD ALLERGY   . Diphenhydramine Hcl Other (See Comments)    TACHYCARDIA  . Novocain [Procaine] Other (See Comments)    Dental - pt has passed out from use of med, dentist no longer gives this to her but she states it was relative to "hitting a nerve"      REVIEW OF SYSTEMS:  A comprehensive review of systems was negative except for: Musculoskeletal: positive for stiff joints   FAMILY HISTORY:  No family history on file.  SOCIAL HISTORY:   Social History  Substance Use Topics  . Smoking status: Never Smoker  . Smokeless tobacco: Never Used  . Alcohol use No     EXAMINATION:  Vital signs in last 24 hours:    There were no vitals taken for this visit.  General Appearance:    Alert, cooperative, no distress, appears stated age  Head:    Normocephalic, without obvious abnormality, atraumatic  Eyes:    PERRL, conjunctiva/corneas clear, EOM's intact, fundi    benign, both eyes  Ears:    Normal TM's and external ear canals, both ears  Nose:   Nares normal, septum midline, mucosa normal, no drainage    or sinus tenderness  Throat:   Lips, mucosa, and tongue normal; teeth and gums normal  Neck:   Supple, symmetrical, trachea midline, no adenopathy;    thyroid:  no enlargement/tenderness/nodules; no carotid   bruit or JVD  Back:     Symmetric, no curvature, ROM normal, no CVA tenderness  Lungs:     Clear to auscultation bilaterally, respirations unlabored  Chest Wall:  No tenderness or deformity   Heart:    Regular rate and rhythm, S1 and S2 normal, no murmur, rub   or gallop  Breast Exam:    No tenderness, masses, or nipple abnormality  Abdomen:     Soft, non-tender, bowel sounds active all four quadrants,    no masses, no organomegaly  Genitalia:    Normal female without lesion, discharge or tenderness  Rectal:    Normal tone, no masses or tenderness;   guaiac negative stool  Extremities:   Extremities normal, atraumatic, no cyanosis or edema  Pulses:   2+ and symmetric all extremities  Skin:   Skin color, texture, turgor normal, no rashes or lesions  Lymph nodes:   Cervical, supraclavicular, and axillary nodes normal  Neurologic:   CNII-XII intact, normal strength, sensation and reflexes    throughout      Musculoskeletal:  ROM 0-120, Ligaments intact,  Imaging Review Plain radiographs demonstrate moderate degenerative joint disease of the left knee. The overall alignment is neutral. The bone quality appears to be excellent for age and reported activity level.  Assessment/Plan: Medial meniscus tear left knee  The patient history, physical examination and imaging studies are consistent with moderate degenerative joint disease of the left knee with a new acute medial meniscus tear. The patient has failed conservative treatment.  The clearance notes were reviewed.  After discussion with the patient it was felt that a knee arthroscopy was indicated. The procedure,  risks, and benefits of knee arthroscopy were presented and reviewed. The risks including but not limited to  infection, blood clots, vascular injury, stiffness, among others were discussed. The patient acknowledged the explanation, agreed to proceed with the plan. Donia Ast 04/22/2016, 10:06 AM

## 2016-04-22 NOTE — Anesthesia Preprocedure Evaluation (Signed)
Anesthesia Evaluation  Patient identified by MRN, date of birth, ID band Patient awake    Reviewed: Allergy & Precautions, H&P , NPO status , Patient's Chart, lab work & pertinent test results, reviewed documented beta blocker date and time   History of Anesthesia Complications (+) PONV and history of anesthetic complications  Airway Mallampati: II  TM Distance: >3 FB Neck ROM: Full    Dental no notable dental hx. (+) Teeth Intact, Dental Advisory Given   Pulmonary shortness of breath and with exertion,    Pulmonary exam normal breath sounds clear to auscultation       Cardiovascular Exercise Tolerance: Poor hypertension, Pt. on medications and Pt. on home beta blockers  Rhythm:Regular Rate:Normal     Neuro/Psych negative neurological ROS  negative psych ROS   GI/Hepatic Neg liver ROS, hiatal hernia,   Endo/Other  negative endocrine ROS  Renal/GU negative Renal ROS  negative genitourinary   Musculoskeletal  (+) Arthritis , Osteoarthritis,    Abdominal   Peds  Hematology negative hematology ROS (+)   Anesthesia Other Findings   Reproductive/Obstetrics negative OB ROS                             BP Readings from Last 3 Encounters:  04/22/16 (!) 198/97  04/19/16 (!) 151/60  01/29/16 (!) 148/95   Lab Results  Component Value Date   WBC 8.0 04/19/2016   HGB 12.4 04/19/2016   HCT 39.6 04/19/2016   MCV 96.8 04/19/2016   PLT 238 04/19/2016     Chemistry      Component Value Date/Time   NA 141 04/19/2016 1549   K 3.8 04/19/2016 1549   CL 105 04/19/2016 1549   CO2 29 04/19/2016 1549   BUN 12 04/19/2016 1549   CREATININE 0.83 04/19/2016 1549      Component Value Date/Time   CALCIUM 9.6 04/19/2016 1549   ALKPHOS 54 10/08/2007 1150   AST 20 10/08/2007 1150   ALT 17 10/08/2007 1150   BILITOT 0.9 10/08/2007 1150     EKG 11/23/15: sinus rhythm. Leftward axis. Possible septal  infarct, age undetermined. Inferior ST-T changes are nonspecific.   Nuclear stress test 03/07/14:  1. Normal coronary flow reserve with lexiscan 2. Normal resting LV systolic function. EF 60%  Echo 03/04/14:  1. Normal LV size with moderate concentric LVH 2. Normal EF 65% 3. Normal RV size and systolic function 4. Estimated RVSP 30 mmHg, low est RAP <3 mmHg 5. Calcified but not stenotic AV  Anesthesia Physical  Anesthesia Plan  ASA: II  Anesthesia Plan: General   Post-op Pain Management:    Induction: Intravenous  Airway Management Planned: LMA  Additional Equipment:   Intra-op Plan:   Post-operative Plan: Extubation in OR  Informed Consent: I have reviewed the patients History and Physical, chart, labs and discussed the procedure including the risks, benefits and alternatives for the proposed anesthesia with the patient or authorized representative who has indicated his/her understanding and acceptance.   Dental advisory given  Plan Discussed with: CRNA  Anesthesia Plan Comments:         Anesthesia Quick Evaluation

## 2016-04-22 NOTE — Transfer of Care (Signed)
Immediate Anesthesia Transfer of Care Note  Patient: Kelly Bird  Procedure(s) Performed: Procedure(s): KNEE ARTHROSCOPY WITH MEDIAL MENISECTOMY (Left)  Patient Location: PACU  Anesthesia Type:General  Level of Consciousness: sedated  Airway & Oxygen Therapy: Patient Spontanous Breathing and Patient connected to nasal cannula oxygen  Post-op Assessment: Report given to RN and Post -op Vital signs reviewed and stable  Post vital signs: Reviewed and stable  Last Vitals:  Vitals:   04/22/16 1254 04/22/16 1629  BP: (!) 198/97   Pulse: 77 73  Resp: 20 15    Last Pain:  Vitals:   04/22/16 1629  PainSc: (P) 0-No pain      Patients Stated Pain Goal: 6 ([id number])  Complications: No apparent anesthesia complications

## 2016-04-23 ENCOUNTER — Encounter (HOSPITAL_COMMUNITY): Payer: Self-pay | Admitting: Orthopedic Surgery

## 2016-04-23 NOTE — Op Note (Signed)
Dictation Number:  [id number]

## 2016-04-24 NOTE — Op Note (Signed)
NAMEJOSELYNNE, AGINS               ACCOUNT NO.:  [id number]  MEDICAL RECORD NO.:  [id number]  LOCATION:                                 FACILITY:  PHYSICIAN:  Estill Bamberg. Ronnie Derby, M.D. DATE OF BIRTH:  09/15/1925  DATE OF PROCEDURE: DATE OF DISCHARGE:                              OPERATIVE REPORT   SURGEON:  Estill Bamberg. Ronnie Derby, M.D.  ASSISTANT:  None.  ANESTHESIA:  General.  PREOPERATIVE DIAGNOSIS:  Left knee medial meniscus tear and osteoarthritis.  POSTOPERATIVE DIAGNOSIS:  Left knee medial meniscus tear and osteoarthritis.  PROCEDURE:  Left knee arthroscopy with partial medial meniscectomy and chondroplasty as well as major synovectomy.  INDICATION FOR PROCEDURE:  The patient is an 82 year old, white female, mechanical symptoms, MRI evidence of meniscal tear.  Informed consent was obtained.  DESCRIPTION OF PROCEDURE:  The patient was laid supine, administered general anesthesia.  The left knee was prepped and draped in the usual fashion.  Inferolateral and inferomedial portals were created with a #11 blade, blunt trocar, and cannula.  Diagnostic arthroscopy revealed grade 2-3 chondromalacia of the patellofemoral joint, lot of synovitis in the medial lateral gutters as well as a large plica.  Synovectomy was carried out in the patellofemoral compartment, medial and lateral gutters.  Light chondroplasty was carried out in the patella.  I went into the medial compartment and the patient had grade 2 chondromalacia, some areas had grade 3 on the tibia, chondroplasty was carried out here as well.  She had a large meniscal root tear.  It was very unstable.  I used the small great White shaver and straight basket forceps to perform partial medial meniscectomy removing approximately 30-40% of the entirety of the of the meniscus.  I then went to the notch, the ACL and PCL were normal.  I then went into the figure 4 position, there was some fraying of the lateral meniscus, and I used  a straight basket forceps to create a nice blunt edge along its entirety. I then lavaged and closed with 4-0 nylon sutures.  Dressed with Xeroform, dressing sponges, sterile Webril, and an Ace wrap. Complications none.  Drains none.    ______________________________ Estill Bamberg. Ronnie Derby, M.D.   ______________________________ Estill Bamberg. Ronnie Derby, M.D.    SDL/MEDQ  D:  04/23/2016  T:  04/24/2016  Job:  [id number]

## 2017-03-31 IMAGING — MR MR KNEE*L* W/O CM
4 of 5 series · 18 of 40 positions shown · non-contrast
Comparison: None.

CLINICAL DATA: Intermittent left knee pain for 1 month.

EXAM:
MRI OF THE LEFT KNEE WITHOUT CONTRAST
TECHNIQUE: Multiplanar, multisequence MR imaging of the knee was performed. No
intravenous contrast was administered.

[Series 3: PD fat-sat · axial · 3.5mm · 0.31mm/px · z∈[-36,+64]mm · 8 of 25 slices shown (1 of 3)]
[im 1/25]
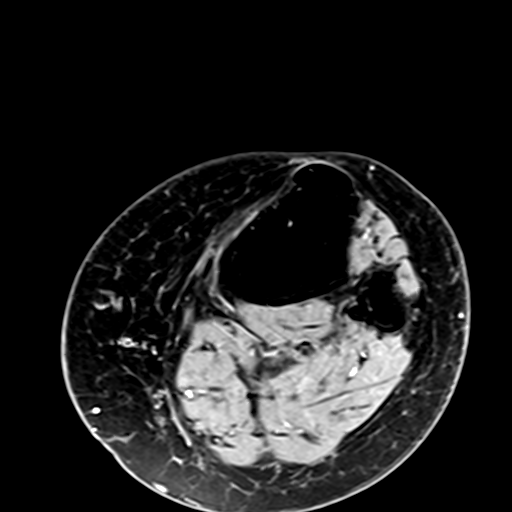
[im 4/25]
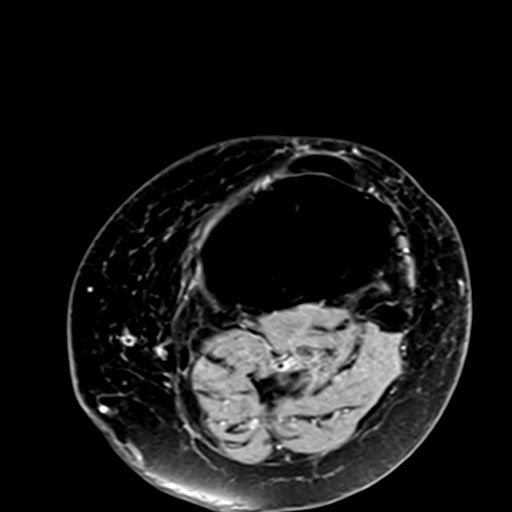
[im 7/25]
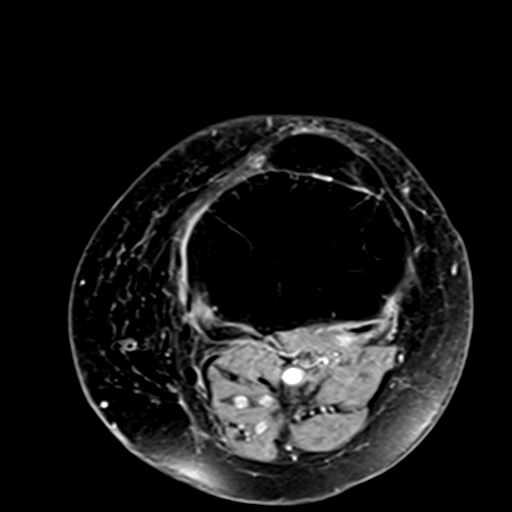
[im 11/25]
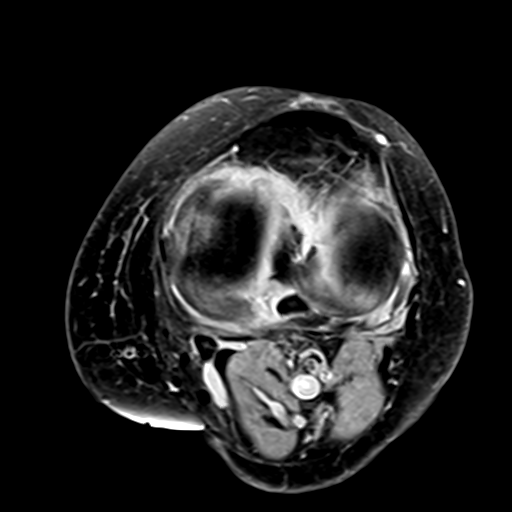
[im 14/25]
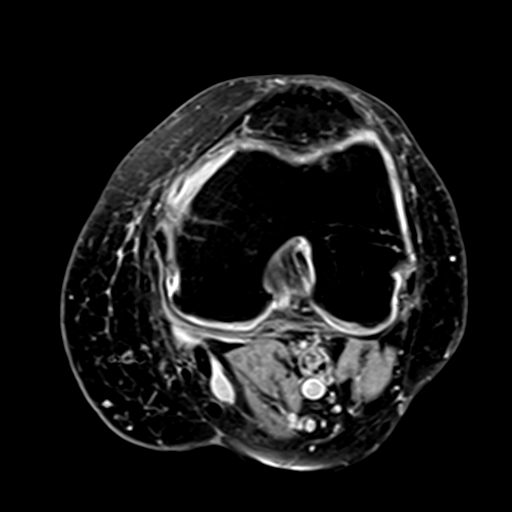
[im 18/25]
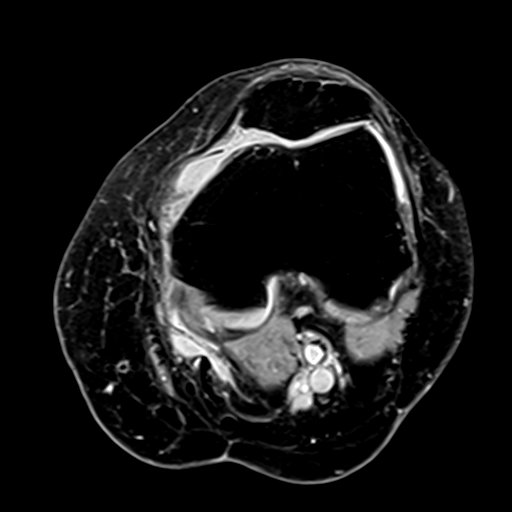
[im 21/25]
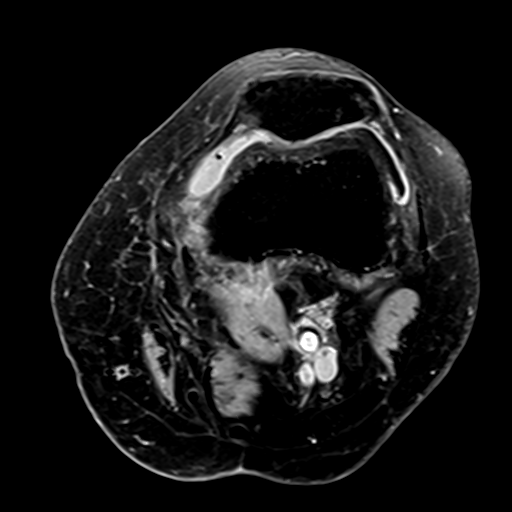
[im 25/25]
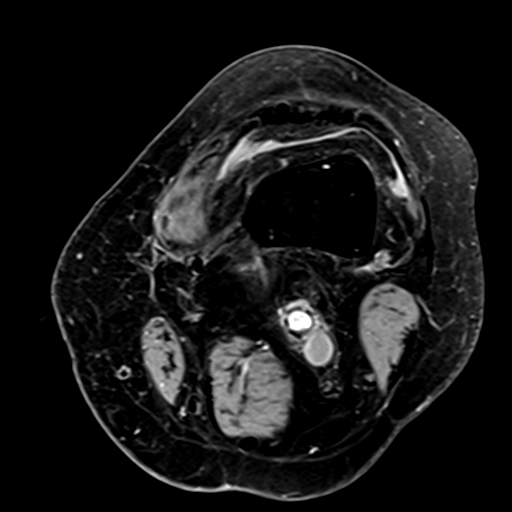

[Series 4: PD fat-sat · coronal · 3.5mm · 0.29mm/px · 4 of 24 slices shown (2 of 3)]
[im 1/24]
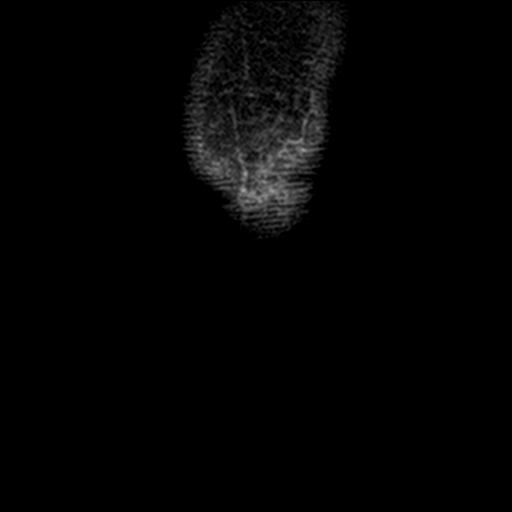
[im 4/24]
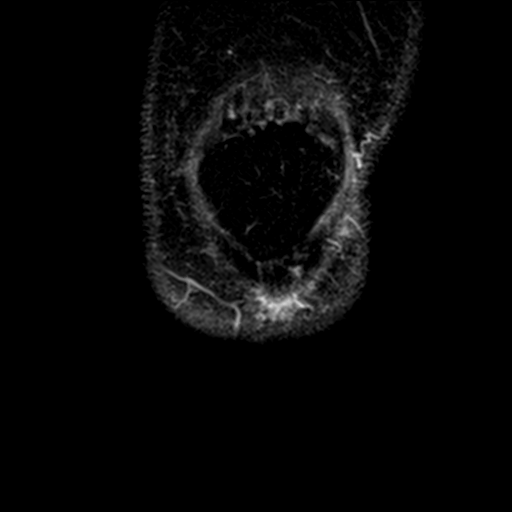
[im 14/24]
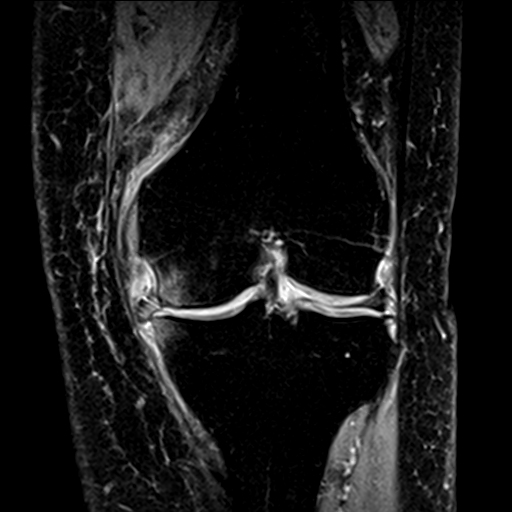
[im 20/24]
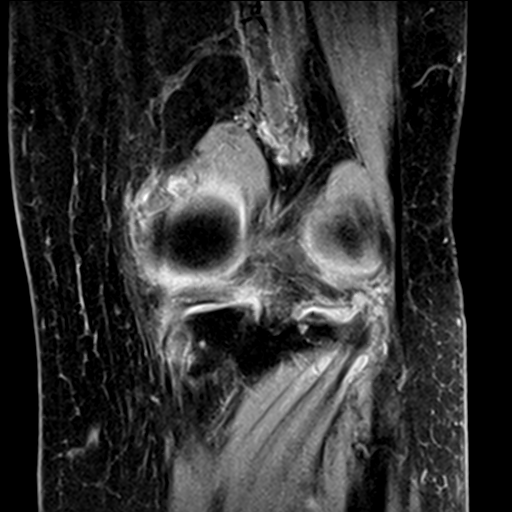

[Series 5: T2 fat-sat · coronal · 3.5mm · 0.29mm/px · 3 of 24 slices shown]
[im 4/24]
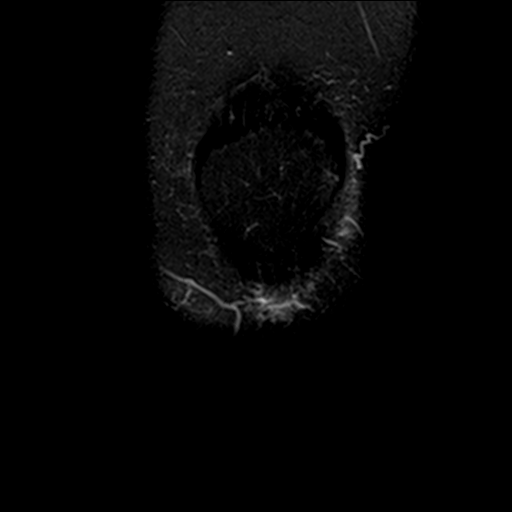
[im 14/24]
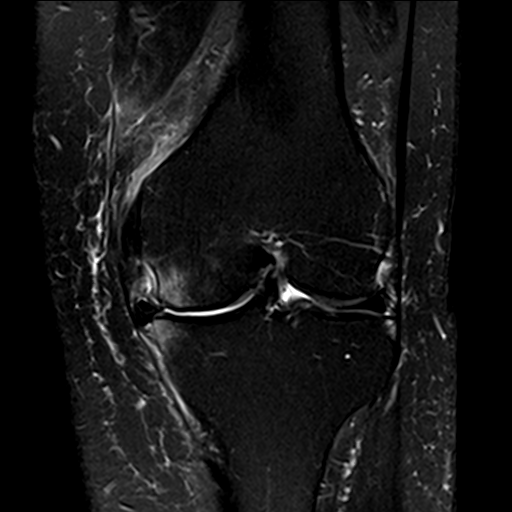
[im 20/24]
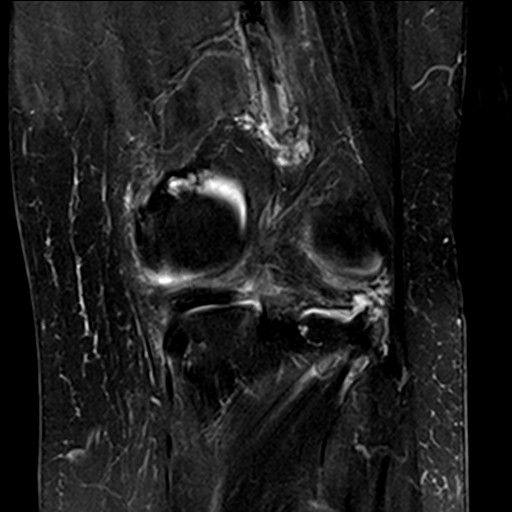

[Series 7: PD fat-sat · sagittal · 3.2mm · 0.29mm/px · 3 of 26 slices shown (3 of 3)]
[im 4/26]
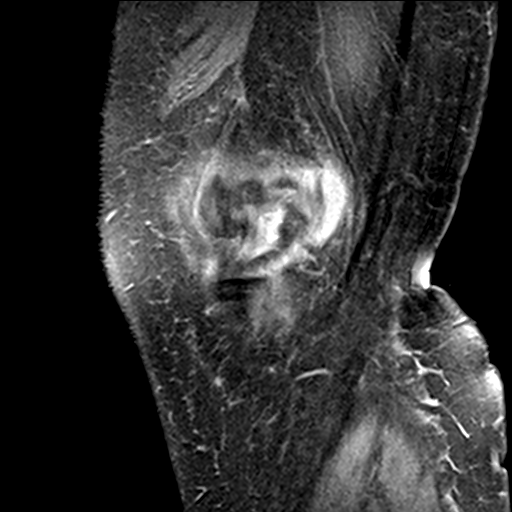
[im 15/26]
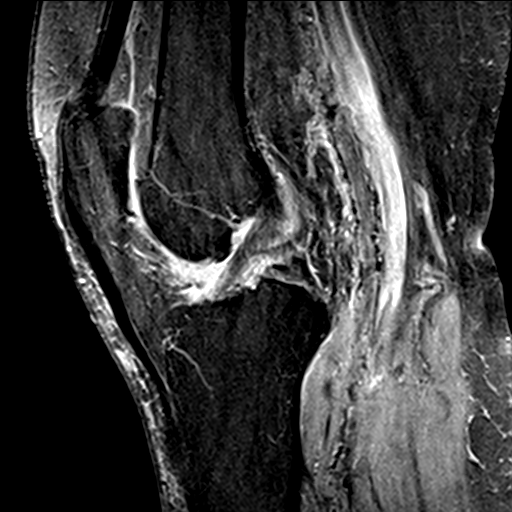
[im 22/26]
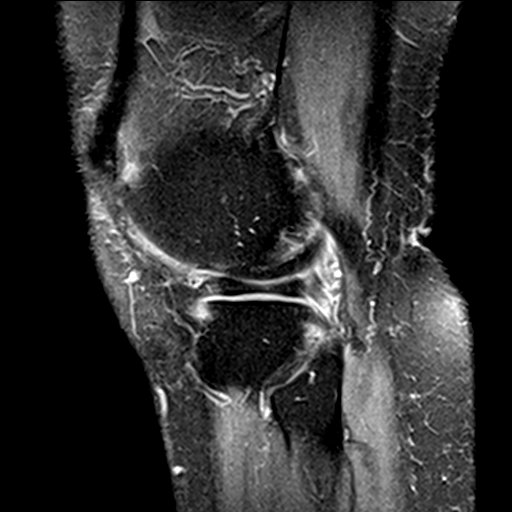

[18 of 40 positions shown; findings below may reference images not displayed]

FINDINGS: Despite efforts by the technologist and patient, motion artifact is
present on today's exam and could not be eliminated. This reduces
exam sensitivity and specificity.

MENISCI

Medial meniscus: Large radial tear of the root of the posterior horn
with adjacent grade 2 signal tracking in the posterior horn and a
grade 1 degenerative signal in the midbody.

Lateral meniscus:  Unremarkable

LIGAMENTS

Cruciates:  Unremarkable

Collaterals: Mild abnormal thickening of the MCL proximally with
low-grade surrounding edema. Appearance compatible with grade 2
sprain.

CARTILAGE

Patellofemoral: Prominent diffuse degenerative chondral thinning.
Marginal spurring.

Medial: Prominent chondral thinning along the medial femoral condyle
with moderate chondral thinning along the medial tibial plateau.
Marginal spurring with adjacent subcortical marrow edema along the
medial tibial rim and adjacent medial rim of the medial femoral
condyle.

Lateral: 4 x 13 mm chondral defect centrally along the lateral
femoral condyle, image [DATE], with adjacent focal chondral thinning in
the lateral tibial plateau. Otherwise mild degenerative chondral
thinning. Marginal spurring.

Joint:  Small knee effusion.

Popliteal Fossa:  Small Baker's cyst.

Extensor Mechanism: Subtle linear increased signal in the distal
quadriceps tendon is likely incidental but in the appropriate
clinical setting could represent a quadriceps sprain.

Bones: No significant extra-articular osseous abnormalities
identified.

Other: No supplemental non-categorized findings.
IMPRESSION: 1. Large radial tear of the root of the posterior horn medial
meniscus.
2. Grade 2 sprain of the proximal MCL.
3. Prominent osteoarthritis, with prominent chondral thinning in the
patellofemoral joint and medial compartment, and a focal chondral
defect centrally along the lateral femoral condyle
4. Small knee effusion with small Baker's cyst.
5. Subtle linear increased signal in the distal quadriceps tendon is
likely incidental but in the appropriate clinical setting could
represent a quadriceps sprain.

## 2023-07-27 DEATH — deceased
